# Patient Record
Sex: Female | Born: 1940 | Race: White | Marital: Married | Smoking: Never smoker
Health system: Southern US, Community
[De-identification: ages and names within clinical notes are randomized; demographics above are authoritative.]

## PROBLEM LIST (undated history)

## (undated) DIAGNOSIS — Z1612 Extended spectrum beta lactamase (ESBL) resistance: Secondary | ICD-10-CM

## (undated) DIAGNOSIS — I110 Hypertensive heart disease with heart failure: Secondary | ICD-10-CM

## (undated) DIAGNOSIS — E213 Hyperparathyroidism, unspecified: Secondary | ICD-10-CM

## (undated) DIAGNOSIS — G3184 Mild cognitive impairment, so stated: Secondary | ICD-10-CM

## (undated) DIAGNOSIS — G309 Alzheimer's disease, unspecified: Secondary | ICD-10-CM

## (undated) DIAGNOSIS — N200 Calculus of kidney: Secondary | ICD-10-CM

## (undated) DIAGNOSIS — F0280 Dementia in other diseases classified elsewhere without behavioral disturbance: Secondary | ICD-10-CM

## (undated) DIAGNOSIS — I429 Cardiomyopathy, unspecified: Secondary | ICD-10-CM

## (undated) DIAGNOSIS — R627 Adult failure to thrive: Secondary | ICD-10-CM

## (undated) DIAGNOSIS — E559 Vitamin D deficiency, unspecified: Secondary | ICD-10-CM

## (undated) DIAGNOSIS — A499 Bacterial infection, unspecified: Secondary | ICD-10-CM

## (undated) HISTORY — DX: Bacterial infection, unspecified: A49.9

## (undated) HISTORY — DX: Alzheimer's disease, unspecified: G30.9

## (undated) HISTORY — DX: Extended spectrum beta lactamase (ESBL) resistance: Z16.12

## (undated) HISTORY — DX: Vitamin D deficiency, unspecified: E55.9

## (undated) HISTORY — DX: Hyperparathyroidism, unspecified: E21.3

## (undated) HISTORY — DX: Dementia in other diseases classified elsewhere, unspecified severity, without behavioral disturbance, psychotic disturbance, mood disturbance, and anxiety: F02.80

## (undated) HISTORY — DX: Hypertensive heart disease with heart failure: I11.0

## (undated) HISTORY — DX: Adult failure to thrive: R62.7

## (undated) HISTORY — PX: TONSILLECTOMY: SUR1361

## (undated) HISTORY — DX: Hypercalcemia: E83.52

## (undated) HISTORY — DX: Calculus of kidney: N20.0

## (undated) HISTORY — DX: Mild cognitive impairment of uncertain or unknown etiology: G31.84

## (undated) HISTORY — DX: Cardiomyopathy, unspecified: I42.9

---

## 2015-08-02 DIAGNOSIS — I110 Hypertensive heart disease with heart failure: Secondary | ICD-10-CM | POA: Insufficient documentation

## 2015-08-31 DIAGNOSIS — R0602 Shortness of breath: Secondary | ICD-10-CM | POA: Insufficient documentation

## 2015-09-21 DIAGNOSIS — I429 Cardiomyopathy, unspecified: Secondary | ICD-10-CM | POA: Insufficient documentation

## 2016-04-15 DIAGNOSIS — N200 Calculus of kidney: Secondary | ICD-10-CM | POA: Insufficient documentation

## 2016-05-23 DIAGNOSIS — R627 Adult failure to thrive: Secondary | ICD-10-CM | POA: Insufficient documentation

## 2016-07-25 DIAGNOSIS — G309 Alzheimer's disease, unspecified: Secondary | ICD-10-CM

## 2016-07-25 DIAGNOSIS — F0280 Dementia in other diseases classified elsewhere without behavioral disturbance: Secondary | ICD-10-CM | POA: Insufficient documentation

## 2016-07-30 DIAGNOSIS — E213 Hyperparathyroidism, unspecified: Secondary | ICD-10-CM | POA: Insufficient documentation

## 2016-09-25 DIAGNOSIS — E210 Primary hyperparathyroidism: Secondary | ICD-10-CM | POA: Insufficient documentation

## 2016-10-23 ENCOUNTER — Encounter: Payer: Self-pay | Admitting: Cardiology

## 2016-10-23 ENCOUNTER — Ambulatory Visit (INDEPENDENT_AMBULATORY_CARE_PROVIDER_SITE_OTHER): Payer: Medicare HMO | Admitting: Cardiology

## 2016-10-23 VITALS — BP 128/70 | HR 64 | Resp 10 | Ht 63.0 in | Wt 127.0 lb

## 2016-10-23 DIAGNOSIS — E785 Hyperlipidemia, unspecified: Secondary | ICD-10-CM | POA: Diagnosis not present

## 2016-10-23 DIAGNOSIS — R0602 Shortness of breath: Secondary | ICD-10-CM

## 2016-10-23 DIAGNOSIS — E210 Primary hyperparathyroidism: Secondary | ICD-10-CM

## 2016-10-23 DIAGNOSIS — R5383 Other fatigue: Secondary | ICD-10-CM | POA: Diagnosis not present

## 2016-10-23 DIAGNOSIS — I420 Dilated cardiomyopathy: Secondary | ICD-10-CM | POA: Diagnosis not present

## 2016-10-23 NOTE — Progress Notes (Signed)
Cardiology Office Note:    Date:  10/23/2016   ID:  [name], [place]OB [birth year], MRN [id number]  PCP:  Everlean CherryWhyte, Thomas M, MD  Cardiologist:  Gypsy Balsamobert Anani Gu, MD    Referring MD: Everlean CherryWhyte, Thomas M, MD   Chief Complaint  Patient presents with  . Pre-op Exam  Needs evaluation before my surgery  History of Present Illness:    Patricia Vang is a 76 y.o. female  with history of cardiomyopathy. She was identified to have problem with paratrhyroid and required surgery. Surgery will be done under general anesthesia. She she needs evaluation for that. From cardiac standpoint of view. She is not too active. She can walk and do things but overall she is very quiet she comes to my room with her daughter. Basically dark talks for her. Having any exertional chest pain tightness squeezing pressure burning chest, no shortness of breath. No swelling of lower extremities. I reviewed her record in June 2017 she had cardiac catheterization which showed normal coronaries. Her ejection fraction at that time was normal.  Past Medical History:  Diagnosis Date  . Alzheimer's dementia   . Cardiomyopathy (HCC)   . ESBL (extended spectrum beta-lactamase) producing bacteria infection   . Failure to thrive in adult   . Hypercalcemia   . Hyperparathyroidism (HCC)   . Hypertensive CHF (HCC)   . Mild cognitive impairment   . Nephrolithiasis   . Vitamin D deficiency     Past Surgical History:  Procedure Laterality Date  . TONSILLECTOMY      Current Medications: Current Meds  Medication Sig  . aspirin EC 81 MG tablet Take 81 mg by mouth daily.  Marland Kitchen. atorvastatin (LIPITOR) 80 MG tablet Take 80 mg by mouth daily.  Marland Kitchen. donepezil (ARICEPT) 5 MG tablet Take 5 mg by mouth at bedtime.  . metoprolol tartrate (LOPRESSOR) 25 MG tablet Take 12.5 mg by mouth 2 (two) times daily.  . Multiple Vitamins-Minerals (CENTRUM SILVER 50+WOMEN PO) Take 1 tablet by mouth daily.  . Multiple Vitamins-Minerals  (PRESERVISION AREDS 2 PO) Take 2 tablets by mouth daily.  . pantoprazole (PROTONIX) 40 MG tablet Take 40 mg by mouth daily.  Marland Kitchen. spironolactone (ALDACTONE) 25 MG tablet Take 0.5 tablets by mouth daily.  . valsartan (DIOVAN) 80 MG tablet Take 80 mg by mouth daily.  . vitamin B-12 (CYANOCOBALAMIN) 1000 MCG tablet Take 1 tablet by mouth daily.     Allergies:   Penicillin g and Shellfish-derived products   Social History   Social History  . Marital status: Married    Spouse name: N/A  . Number of children: N/A  . Years of education: N/A   Social History Main Topics  . Smoking status: Never Smoker  . Smokeless tobacco: Never Used  . Alcohol use No  . Drug use: No  . Sexual activity: Not Asked   Other Topics Concern  . None   Social History Narrative  . None     Family History: The patient's family history includes Alzheimer's disease in her daughter and father. ROS:   Please see the history of present illness.    All 14 point review of systems negative except as described per history of present illness  EKGs/Labs/Other Studies Reviewed:      Recent Labs: No results found for requested labs within last 8760 hours.  Recent Lipid Panel No results found for: CHOL, TRIG, HDL, CHOLHDL, VLDL, LDLCALC, LDLDIRECT  Physical Exam:    VS:  BP 128/70  Pulse 64   Resp 10   Ht 5\' 3"  (1.6 m)   Wt 127 lb (57.6 kg)   BMI 22.50 kg/m     Wt Readings from Last 3 Encounters:  10/23/16 127 lb (57.6 kg)     GEN:  Well nourished, well developed in no acute distress HEENT: Normal NECK: No JVD; No carotid bruits LYMPHATICS: No lymphadenopathy CARDIAC: RRR, no murmurs, no rubs, no gallops RESPIRATORY:  Clear to auscultation without rales, wheezing or rhonchi  ABDOMEN: Soft, non-tender, non-distended MUSCULOSKELETAL:  No edema; No deformity  SKIN: Warm and dry LOWER EXTREMITIES: no swelling NEUROLOGIC:  Alert and oriented x 3 PSYCHIATRIC:  Normal affect   ASSESSMENT:    1.  Dilated cardiomyopathy (HCC)   2. Primary hyperparathyroidism (HCC)   3. Shortness of breath   4. Fatigue, unspecified type   5. Dyslipidemia    PLAN:    In order of problems listed above:  1. History of dilated coronary myopathy: I will ask her to have echocardiogram to check left ventricular ejection fraction. She is on metoprolol which I will continue. She is also on spironolactone and Diovan which I'll continue for now. 2. Dyslipidemia: Being followed by internal medicine team she is on high intensity statin I'll continue. 3. Shortness of breath: We'll do echocardiogram to assess left ventricular ejection fraction. 4. Hyperparathyroidism: That we will be addressed surgically.  Pre-op evaluation for this lady with history of cardiomyopathy. Luckily year ago cardiac catheterization showed normal coronaries, she does not have any symptoms that would suggest reactivation of the problem: Therefore from cardiac standpoint to be an coronary artery point review of I don't think we need to do any evaluation for this. However, echocardiogram is necessary to reassess left ventricular ejection fraction because of very clear history. Other issues appears to be stable if her echocardiogram is fine she be at low risk for surgery from cardiac standpoint of view. I will maintain her on beta blocker around surgical time.   Medication Adjustments/Labs and Tests Ordered: Current medicines are reviewed at length with the patient today.  Concerns regarding medicines are outlined above.  No orders of the defined types were placed in this encounter.  Medication changes: No orders of the defined types were placed in this encounter.   Signed, Georgeanna Lea, MD, [facility] 10/23/2016 9:58 AM    [facility]

## 2016-10-23 NOTE — Patient Instructions (Addendum)
Medication Instructions:  Your physician recommends that you continue on your current medications as directed. Please refer to the Current Medication list given to you today. Labwork: None   Testing/Procedures: Your physician has requested that you have an echocardiogram. Echocardiography is a painless test that uses sound waves to create images of your heart. It provides your doctor with information about the size and shape of your heart and how well your heart's chambers and valves are working. This procedure takes approximately one hour. There are no restrictions for this procedure.  Follow-Up: Your physician wants you to follow-up in: 6 months. You will receive a reminder letter in the mail two months in advance. If you don't receive a letter, please call our office to schedule the follow-up appointment. Any Other Special Instructions Will Be Listed Below (If Applicable).  Please note that any paperwork needing to be filled out by the provider will need to be addressed at the front desk prior to seeing the provider. Please note that any paperwork FMLA, Disability or other documents regarding health condition is subject to a $25.00 charge that must be received prior to completion of paperwork.    If you need a refill on your cardiac medications before your next appointment, please call your pharmacy.

## 2016-10-26 DIAGNOSIS — I420 Dilated cardiomyopathy: Secondary | ICD-10-CM | POA: Diagnosis not present

## 2016-11-05 ENCOUNTER — Telehealth: Payer: Self-pay | Admitting: Cardiology

## 2016-11-05 NOTE — Telephone Encounter (Signed)
S/w spouse and advised of results of echo. I encouraged her and spouse that I would call them back once Dr. Bing Matter has reviewed the results.

## 2016-11-05 NOTE — Telephone Encounter (Signed)
Wants echo results from [facility]

## 2016-11-14 ENCOUNTER — Other Ambulatory Visit: Payer: Self-pay

## 2016-11-14 DIAGNOSIS — I420 Dilated cardiomyopathy: Secondary | ICD-10-CM

## 2016-12-19 DIAGNOSIS — Z01818 Encounter for other preprocedural examination: Secondary | ICD-10-CM

## 2017-04-29 ENCOUNTER — Ambulatory Visit: Payer: Medicare HMO | Admitting: Cardiology

## 2017-04-30 ENCOUNTER — Ambulatory Visit: Payer: Medicare HMO | Admitting: Cardiology

## 2017-04-30 ENCOUNTER — Encounter: Payer: Self-pay | Admitting: Cardiology

## 2017-04-30 VITALS — BP 116/70 | HR 75 | Ht 63.0 in | Wt 117.8 lb

## 2017-04-30 DIAGNOSIS — R5383 Other fatigue: Secondary | ICD-10-CM

## 2017-04-30 DIAGNOSIS — F0280 Dementia in other diseases classified elsewhere without behavioral disturbance: Secondary | ICD-10-CM | POA: Diagnosis not present

## 2017-04-30 DIAGNOSIS — I420 Dilated cardiomyopathy: Secondary | ICD-10-CM

## 2017-04-30 DIAGNOSIS — I5032 Chronic diastolic (congestive) heart failure: Secondary | ICD-10-CM

## 2017-04-30 DIAGNOSIS — I110 Hypertensive heart disease with heart failure: Secondary | ICD-10-CM

## 2017-04-30 DIAGNOSIS — G301 Alzheimer's disease with late onset: Secondary | ICD-10-CM | POA: Diagnosis not present

## 2017-04-30 NOTE — Patient Instructions (Signed)
Medication Instructions:  Your physician recommends that you continue on your current medications as directed. Please refer to the Current Medication list given to you today.  Labwork: Your physician recommends that you have lab work today: TSH and B-12  Testing/Procedures: None ordered  Follow-Up: Your physician recommends that you schedule a follow-up appointment in: 6 months with [place]. [place]   Any Other Special Instructions Will Be Listed Below (If Applicable).     If you need a refill on your cardiac medications before your next appointment, please call your pharmacy.

## 2017-04-30 NOTE — Addendum Note (Signed)
Addended by: Arville CareHUNT, Eissa Buchberger N on: 04/30/2017 03:25 PM   Modules accepted: Orders

## 2017-04-30 NOTE — Progress Notes (Signed)
Cardiology Office Note:    Date:  04/30/2017   ID:  Patricia GrumblingClara Mae Hilton Vang, [place]OB [birth year], MRN [id number]  PCP:  Everlean CherryWhyte, Thomas M, MD  Cardiologist:  Gypsy Balsamobert Jawan Chavarria, MD    Referring MD: Everlean CherryWhyte, Thomas M, MD   Chief Complaint  Patient presents with  . Follow-up  Doing well cardiac wise  History of Present Illness:    Patricia Vang is a 77 y.o. female with history of cardiomyopathy however normalization based on last echocardiogram done just few months ago.  She did have surgery for parathyroid went to surgery with no problems however still feel weak tired and exhausted.  According to her daughter she sleeps all day she does not do anything she talks very little.  She does have diagnosis of dementia and she is on medication for it.  Denies have any cardiac complaint.  No chest pain tightness squeezing pressure burning in chest  Past Medical History:  Diagnosis Date  . Alzheimer's dementia   . Cardiomyopathy (HCC)   . ESBL (extended spectrum beta-lactamase) producing bacteria infection   . Failure to thrive in adult   . Hypercalcemia   . Hyperparathyroidism (HCC)   . Hypertensive CHF (HCC)   . Mild cognitive impairment   . Nephrolithiasis   . Vitamin D deficiency     Past Surgical History:  Procedure Laterality Date  . TONSILLECTOMY      Current Medications: Current Meds  Medication Sig  . aspirin EC 81 MG tablet Take 81 mg by mouth daily.  Marland Kitchen. atorvastatin (LIPITOR) 80 MG tablet Take 80 mg by mouth daily.  Marland Kitchen. donepezil (ARICEPT) 5 MG tablet Take 5 mg by mouth at bedtime.  . metoprolol tartrate (LOPRESSOR) 25 MG tablet Take 12.5 mg by mouth 2 (two) times daily.  . Multiple Vitamins-Minerals (CENTRUM SILVER 50+WOMEN PO) Take 1 tablet by mouth daily.  . Multiple Vitamins-Minerals (PRESERVISION AREDS 2 PO) Take 2 tablets by mouth daily.  . pantoprazole (PROTONIX) 40 MG tablet Take 40 mg by mouth daily.  . Pseudoephedrine-Guaifenesin (MUCINEX D PO) Take by mouth  every 12 (twelve) hours as needed.  Marland Kitchen. spironolactone (ALDACTONE) 25 MG tablet Take 0.5 tablets by mouth daily.  . vitamin B-12 (CYANOCOBALAMIN) 1000 MCG tablet Take 1 tablet by mouth daily.  . [DISCONTINUED] vitamin B-12 (CYANOCOBALAMIN) 1000 MCG tablet Take by mouth.     Allergies:   Penicillin g and Shellfish-derived products   Social History   Socioeconomic History  . Marital status: Married    Spouse name: None  . Number of children: None  . Years of education: None  . Highest education level: None  Social Needs  . Financial resource strain: None  . Food insecurity - worry: None  . Food insecurity - inability: None  . Transportation needs - medical: None  . Transportation needs - non-medical: None  Occupational History  . None  Tobacco Use  . Smoking status: Never Smoker  . Smokeless tobacco: Never Used  Substance and Sexual Activity  . Alcohol use: No  . Drug use: No  . Sexual activity: None  Other Topics Concern  . None  Social History Narrative  . None     Family History: The patient's family history includes Alzheimer's disease in her daughter and father. ROS:   Please see the history of present illness.    All 14 point review of systems negative except as described per history of present illness  EKGs/Labs/Other Studies Reviewed:      Recent  Labs: No results found for requested labs within last 8760 hours.  Recent Lipid Panel No results found for: CHOL, TRIG, HDL, CHOLHDL, VLDL, LDLCALC, LDLDIRECT  Physical Exam:    VS:  BP 116/70 (BP Location: Left Arm, Patient Position: Sitting, Cuff Size: Normal)   Pulse 75   Ht 5\' 3"  (1.6 m)   Wt 117 lb 12.8 oz (53.4 kg)   SpO2 98%   BMI 20.87 kg/m     Wt Readings from Last 3 Encounters:  04/30/17 117 lb 12.8 oz (53.4 kg)  10/23/16 127 lb (57.6 kg)     GEN:  Well nourished, well developed in no acute distress HEENT: Normal NECK: No JVD; No carotid bruits LYMPHATICS: No lymphadenopathy CARDIAC: RRR,  no murmurs, no rubs, no gallops RESPIRATORY:  Clear to auscultation without rales, wheezing or rhonchi  ABDOMEN: Soft, non-tender, non-distended MUSCULOSKELETAL:  No edema; No deformity  SKIN: Warm and dry LOWER EXTREMITIES: no swelling NEUROLOGIC:  Alert and oriented x 3 PSYCHIATRIC:  Normal affect   ASSESSMENT:    1. Hypertensive heart disease with chronic diastolic congestive heart failure (HCC)   2. Late onset Alzheimer's disease without behavioral disturbance   3. Dilated cardiomyopathy (HCC)    PLAN:    In order of problems listed above:  1. Hypertensive heart disease: Blood pressure well controlled on appropriate medication which I will continue in the matter-of-fact recently her medication to be adjusted meaning dose of lower now because of low blood pressure. 2. Dementia: Followed by internal medicine team. 3. Cardiomyopathy: Last echocardiogram showed preserved left ventricular ejection fraction.  We will continue monitoring. 4. Profound weakness fatigue and sleepiness.  I will ask you to have TSH done as well as B12 point he should take some B12 oral supplement.  I would make sure she does not have problem with intrinsic factor that can lead to significant B12 deficiency and dementia   Medication Adjustments/Labs and Tests Ordered: Current medicines are reviewed at length with the patient today.  Concerns regarding medicines are outlined above.  No orders of the defined types were placed in this encounter.  Medication changes: No orders of the defined types were placed in this encounter.   Signed, Georgeanna Lea, MD, [facility] 04/30/2017 3:20 PM    [facility]

## 2017-05-01 ENCOUNTER — Telehealth: Payer: Self-pay | Admitting: Cardiology

## 2017-05-01 LAB — VITAMIN B12

## 2017-05-01 LAB — TSH: TSH: 1.88 u[IU]/mL (ref 0.450–4.500)

## 2017-05-01 NOTE — Telephone Encounter (Signed)
Please call Boyd Kerbsenny with vitamin B12 results for [place]

## 2017-05-02 NOTE — Telephone Encounter (Signed)
Penny notified of Vitamin B12 results.  Boyd Kerbsenny also advised to contact PCP for further instruction on B12 levels.  Boyd Kerbsenny verbalized understanding.

## 2017-12-19 ENCOUNTER — Ambulatory Visit: Payer: Medicare HMO | Admitting: Cardiology

## 2019-05-26 DIAGNOSIS — I340 Nonrheumatic mitral (valve) insufficiency: Secondary | ICD-10-CM

## 2019-05-26 DIAGNOSIS — I361 Nonrheumatic tricuspid (valve) insufficiency: Secondary | ICD-10-CM

## 2019-10-02 ENCOUNTER — Telehealth: Payer: Self-pay | Admitting: Internal Medicine

## 2019-10-02 ENCOUNTER — Inpatient Hospital Stay (HOSPITAL_COMMUNITY)
Admission: AD | Admit: 2019-10-02 | Discharge: 2019-10-18 | DRG: 871 | Disposition: E | Payer: No Typology Code available for payment source | Source: Other Acute Inpatient Hospital | Attending: Critical Care Medicine | Admitting: Critical Care Medicine

## 2019-10-02 DIAGNOSIS — F0280 Dementia in other diseases classified elsewhere without behavioral disturbance: Secondary | ICD-10-CM | POA: Diagnosis present

## 2019-10-02 DIAGNOSIS — Z79899 Other long term (current) drug therapy: Secondary | ICD-10-CM

## 2019-10-02 DIAGNOSIS — I110 Hypertensive heart disease with heart failure: Secondary | ICD-10-CM | POA: Diagnosis present

## 2019-10-02 DIAGNOSIS — Z87440 Personal history of urinary (tract) infections: Secondary | ICD-10-CM | POA: Diagnosis not present

## 2019-10-02 DIAGNOSIS — R4182 Altered mental status, unspecified: Secondary | ICD-10-CM | POA: Diagnosis present

## 2019-10-02 DIAGNOSIS — R0603 Acute respiratory distress: Secondary | ICD-10-CM

## 2019-10-02 DIAGNOSIS — D649 Anemia, unspecified: Secondary | ICD-10-CM | POA: Diagnosis present

## 2019-10-02 DIAGNOSIS — R6521 Severe sepsis with septic shock: Secondary | ICD-10-CM | POA: Diagnosis present

## 2019-10-02 DIAGNOSIS — J189 Pneumonia, unspecified organism: Secondary | ICD-10-CM | POA: Diagnosis present

## 2019-10-02 DIAGNOSIS — G309 Alzheimer's disease, unspecified: Secondary | ICD-10-CM | POA: Diagnosis present

## 2019-10-02 DIAGNOSIS — E213 Hyperparathyroidism, unspecified: Secondary | ICD-10-CM | POA: Diagnosis present

## 2019-10-02 DIAGNOSIS — Z66 Do not resuscitate: Secondary | ICD-10-CM | POA: Diagnosis not present

## 2019-10-02 DIAGNOSIS — L89152 Pressure ulcer of sacral region, stage 2: Secondary | ICD-10-CM | POA: Diagnosis present

## 2019-10-02 DIAGNOSIS — J9601 Acute respiratory failure with hypoxia: Secondary | ICD-10-CM | POA: Diagnosis present

## 2019-10-02 DIAGNOSIS — E872 Acidosis: Secondary | ICD-10-CM | POA: Diagnosis present

## 2019-10-02 DIAGNOSIS — Z8619 Personal history of other infectious and parasitic diseases: Secondary | ICD-10-CM | POA: Diagnosis not present

## 2019-10-02 DIAGNOSIS — A419 Sepsis, unspecified organism: Secondary | ICD-10-CM | POA: Diagnosis present

## 2019-10-02 DIAGNOSIS — E870 Hyperosmolality and hypernatremia: Secondary | ICD-10-CM | POA: Diagnosis present

## 2019-10-02 DIAGNOSIS — E559 Vitamin D deficiency, unspecified: Secondary | ICD-10-CM | POA: Diagnosis present

## 2019-10-02 DIAGNOSIS — T82528A Displacement of other cardiac and vascular devices and implants, initial encounter: Secondary | ICD-10-CM

## 2019-10-02 DIAGNOSIS — D696 Thrombocytopenia, unspecified: Secondary | ICD-10-CM | POA: Diagnosis present

## 2019-10-02 DIAGNOSIS — N200 Calculus of kidney: Secondary | ICD-10-CM

## 2019-10-02 DIAGNOSIS — Z20822 Contact with and (suspected) exposure to covid-19: Secondary | ICD-10-CM | POA: Diagnosis present

## 2019-10-02 DIAGNOSIS — B964 Proteus (mirabilis) (morganii) as the cause of diseases classified elsewhere: Secondary | ICD-10-CM | POA: Diagnosis present

## 2019-10-02 DIAGNOSIS — Z7982 Long term (current) use of aspirin: Secondary | ICD-10-CM

## 2019-10-02 DIAGNOSIS — Z91013 Allergy to seafood: Secondary | ICD-10-CM

## 2019-10-02 DIAGNOSIS — Z87442 Personal history of urinary calculi: Secondary | ICD-10-CM | POA: Diagnosis not present

## 2019-10-02 DIAGNOSIS — N179 Acute kidney failure, unspecified: Secondary | ICD-10-CM | POA: Diagnosis present

## 2019-10-02 DIAGNOSIS — Z880 Allergy status to penicillin: Secondary | ICD-10-CM

## 2019-10-02 DIAGNOSIS — N136 Pyonephrosis: Secondary | ICD-10-CM | POA: Diagnosis present

## 2019-10-02 DIAGNOSIS — I509 Heart failure, unspecified: Secondary | ICD-10-CM | POA: Diagnosis present

## 2019-10-02 DIAGNOSIS — Y95 Nosocomial condition: Secondary | ICD-10-CM | POA: Diagnosis present

## 2019-10-02 DIAGNOSIS — E8729 Other acidosis: Secondary | ICD-10-CM | POA: Diagnosis not present

## 2019-10-02 DIAGNOSIS — Z515 Encounter for palliative care: Secondary | ICD-10-CM | POA: Diagnosis not present

## 2019-10-02 DIAGNOSIS — I429 Cardiomyopathy, unspecified: Secondary | ICD-10-CM | POA: Diagnosis present

## 2019-10-02 DIAGNOSIS — Z820 Family history of epilepsy and other diseases of the nervous system: Secondary | ICD-10-CM

## 2019-10-02 DIAGNOSIS — L8990 Pressure ulcer of unspecified site, unspecified stage: Secondary | ICD-10-CM | POA: Insufficient documentation

## 2019-10-02 LAB — COMPREHENSIVE METABOLIC PANEL
ALT: 38 U/L (ref 0–44)
AST: 68 U/L — ABNORMAL HIGH (ref 15–41)
Albumin: 2.4 g/dL — ABNORMAL LOW (ref 3.5–5.0)
Alkaline Phosphatase: 121 U/L (ref 38–126)
Anion gap: 10 (ref 5–15)
BUN: 59 mg/dL — ABNORMAL HIGH (ref 8–23)
CO2: 18 mmol/L — ABNORMAL LOW (ref 22–32)
Calcium: 7.5 mg/dL — ABNORMAL LOW (ref 8.9–10.3)
Chloride: 107 mmol/L (ref 98–111)
Creatinine, Ser: 5.47 mg/dL — ABNORMAL HIGH (ref 0.44–1.00)
GFR calc Af Amer: 8 mL/min — ABNORMAL LOW (ref >60)
GFR calc non Af Amer: 7 mL/min — ABNORMAL LOW (ref >60)
Glucose, Bld: 82 mg/dL (ref 70–99)
Potassium: 4.4 mmol/L (ref 3.5–5.1)
Sodium: 135 mmol/L (ref 135–145)
Total Bilirubin: 0.7 mg/dL (ref 0.3–1.2)
Total Protein: 5.5 g/dL — ABNORMAL LOW (ref 6.5–8.1)

## 2019-10-02 LAB — MRSA PCR SCREENING: MRSA by PCR: NEGATIVE

## 2019-10-02 MED ORDER — SODIUM CHLORIDE 0.9 % IV SOLN
250.0000 mL | INTRAVENOUS | Status: DC
  Administered 2019-10-02: 250 mL via INTRAVENOUS

## 2019-10-02 MED ORDER — DOCUSATE SODIUM 100 MG PO CAPS: 100.0000 mg | ORAL_CAPSULE | Freq: Two times a day (BID) | ORAL | Status: DC | PRN

## 2019-10-02 MED ORDER — NOREPINEPHRINE 4 MG/250ML-% IV SOLN: 0.0000 ug/min | INTRAVENOUS | Status: DC

## 2019-10-02 MED ORDER — SODIUM CHLORIDE 0.9 % IV SOLN
2.0000 g | Freq: Every day | INTRAVENOUS | Status: DC
  Administered 2019-10-02 – 2019-10-03 (×2): 2 g via INTRAVENOUS
  Filled 2019-10-02 (×2): qty 20

## 2019-10-02 MED ORDER — NOREPINEPHRINE 4 MG/250ML-% IV SOLN
2.0000 ug/min | INTRAVENOUS | Status: DC
  Administered 2019-10-02: 2 ug/min via INTRAVENOUS
  Filled 2019-10-02: qty 250

## 2019-10-02 MED ORDER — CHLORHEXIDINE GLUCONATE CLOTH 2 % EX PADS
6.0000 | MEDICATED_PAD | Freq: Every day | CUTANEOUS | Status: DC
  Administered 2019-10-03 – 2019-10-06 (×4): 6 via TOPICAL

## 2019-10-02 MED ORDER — POLYETHYLENE GLYCOL 3350 17 G PO PACK: 17.0000 g | PACK | Freq: Every day | ORAL | Status: DC | PRN

## 2019-10-02 NOTE — Telephone Encounter (Signed)
Call from Patricia Vang ER regading Patricia Vang 28-Apr-1940 [address] [place] [address]   Hx of dementia. Lives at home and [facility].   UTI - placed on bactrim x 2 days. Getting worse. Walking less, More lethargic. Hypotensie reoute 90/50 and fluid 300cc given by EMS and responsded. Afebrile    has a past medical history of Alzheimer's dementia, Cardiomyopathy (HCC), ESBL (extended spectrum beta-lactamase) producing bacteria infection, Failure to thrive in adult, Hypercalcemia, Hyperparathyroidism (HCC), Hypertensive CHF (HCC), Mild cognitive impairment, Nephrolithiasis, and Vitamin D deficiency.   has a past surgical history that includes Tonsillectomy.  Scheduled Meds: Continuous Infusions: PRN Meds:.   Current Outpatient Medications  Medication Instructions  . aspirin EC 81 mg, Oral, Daily  . atorvastatin (LIPITOR) 80 mg, Oral, Daily  . donepezil (ARICEPT) 5 mg, Oral, Daily at bedtime  . metoprolol tartrate (LOPRESSOR) 12.5 mg, Oral, 2 times daily  . Multiple Vitamins-Minerals (CENTRUM SILVER 50+WOMEN PO) 1 tablet, Oral, Daily  . Multiple Vitamins-Minerals (PRESERVISION AREDS 2 PO) 2 tablets, Oral, Daily  . pantoprazole (PROTONIX) 40 mg, Oral, Daily  . Pseudoephedrine-Guaifenesin (MUCINEX D PO) Oral, Every 12 hours PRN  . spironolactone (ALDACTONE) 25 MG tablet 0.5 tablets, Oral, Daily  . vitamin B-12 (CYANOCOBALAMIN) 1000 MCG tablet 1 tablet, Oral, Daily     [facility]  - non verbal at baseline - other than yes/no which is baseline - not oriented also on baseline - after 2L fluids SBP 80/42 and ER goind to start on levophed, HR 80s, afebrile, 98% on RA - Not in distress - abd soft  Labs  - UA dirty - WBC high  - lacatate  - creat 5.5 (baseline 0.9) - lactate 4.5 - CT abd - left sided proximal ureter 1cm - > HYDRO  A Uti  Septic shock Lactic acodis AKI - with hydro  P (no ICU bed at  [place]) - [place] [facility] ICU due to urology proximity but needs perc drain by IR first - ceftriaxone  -- fluids annd levophed      SIGNATURE    Patricia. Kalman Shan, M.D., F.C.C.P,  Pulmonary and Critical Care Medicine Staff Physician, [facility] Center Director - Interstitial Lung Disease  Program  Pulmonary Fibrosis [facility] [place], [place], [address]  Pager: [phone], If no answer or between  15:00h - 7:00h: call [phone] Telephone: [phone]  5:35 PM Oct 19, 2019

## 2019-10-02 NOTE — H&P (Signed)
NAME:  Patricia Vang, MRN:  [id number], DOB:  29-Jul-1940, LOS: 0 ADMISSION DATE:  09/24/2019, CONSULTATION DATE:  7/16 REFERRING MD:  Duke Salvia, CHIEF COMPLAINT:  Sepsis, hydronephrosis   Brief History   79yo non-verbal female with hx dementia, CHF with recent UTI treated with bactrim x2 days but presented to [facility] 7/16 with worsening mental status, less mobile at home.  Hypotensive on arrival with SBP 90's, AKI with Scr 5.5, lactate 4.5. Remaining labs include WBC 15.4 Hg 11.2 Plt 67, PT/INR 11.4/1.1, Na 137 K 3.8 Cl 102 CO2 16 BUN/Cr 61/5.5, Tbili 0.3 CT abd revealed moderate L hydronephrosis extending down to a 1.0 cm proximal ureteral calculus and just proximal to this is a 0.6 cm proximal uteral calculus. UA opaque and brown with 1+ protein, 3+ blood, 3+ leuks. She was tx to [facility] for ICU care and IR/urology.    History of present illness   79yo nonverbal female with hx dementia, CHF with recent UTI treated with bactrim x2 days but presented to [facility] 7/16 with worsening mental status, less mobile at home.  Hypotensive on arrival with SBP 90's, AKI with Scr 5.5, lactate 4.5. CT abd revealed hydronephrosis. At [facility] ED, patient was given Ceftriaxone, 1L NS bolus x 3. Central line was placed for initiation of levophed. She was tx to [facility] for ICU care and IR/urology. On arrival, she is non-verbal and on low-dose vasopressor support.    Past Medical History   has a past medical history of Alzheimer's dementia, Cardiomyopathy (HCC), ESBL (extended spectrum beta-lactamase) producing bacteria infection, Failure to thrive in adult, Hypercalcemia, Hyperparathyroidism (HCC), Hypertensive CHF (HCC), Mild cognitive impairment, Nephrolithiasis, and Vitamin D deficiency.   Significant Hospital Events   7/16 Transferred from [facility] to [facility]  Consults:  IR urology  Procedures:  R IJ CVL ([place]) 7/16>>>  Significant Diagnostic Tests:  CT abd/pelvis  ([place])>>>  Micro Data:  Urine 7/16>>> BC x 2 7/16>>>  Antimicrobials:  Ceftriaxone 7/16>>>   Interim history/subjective:    Objective   There were no vitals taken for this visit.       No intake or output data in the 24 hours ending 09/28/2019 2323 There were no vitals filed for this visit.  Physical Exam: General: Elderly, chronically ill-appearing, no acute distress, non-verbal HENT: [place], AT, OP clear, MMM Eyes: EOMI, no scleral icterus Respiratory: Clear to auscultation bilaterally.  No crackles, wheezing or rales Cardiovascular: Tachycardic, regular rhythm, -M/R/G, no JVD GI: BS+,  L flank tenderness to palpation Extremities:-Edema,-tenderness Neuro: Awake, alert, non-verbal, does not follow commands, moves extremities x 4 Skin: Intact, no rashes or bruising  Resolved Hospital Problem list     Assessment & Plan:   Septic shock - in setting UTI/pyelonephritis with hydronephrosis.  PLAN -  IV fluids  Continue ceftriaxone Trend lactate Continue levophed - wean as able to keep MAP >65 IR perc nephrostomy  Urology input  Pan culture   AKI  PLAN -  Volume as above  F/u chem now and in am  Consulted IR to evaluate for perc drain   Hx dementia - nonverbal at baseline  PLAN -  Supportive care   Best practice:  Diet: NPO Pain/Anxiety/Delirium protocol (if indicated): n/a VAP protocol (if indicated): n/a DVT prophylaxis: Hold anticoagulation in setting of pending procedure GI prophylaxis: n/a Glucose control: n/a Mobility: BR Code Status: Full code Family Communication: Jethro Poling, patient's son and HCPOA [phone]) Disposition:   Labs   CBC: No results  for input(s): WBC, NEUTROABS, HGB, HCT, MCV, PLT in the last 168 hours.  Basic Metabolic Panel: No results for input(s): NA, K, CL, CO2, GLUCOSE, BUN, CREATININE, CALCIUM, MG, PHOS in the last 168 hours. GFR: CrCl cannot be calculated (No successful lab value found.). No results for input(s):  PROCALCITON, WBC, LATICACIDVEN in the last 168 hours.  Liver Function Tests: No results for input(s): AST, ALT, ALKPHOS, BILITOT, PROT, ALBUMIN in the last 168 hours. No results for input(s): LIPASE, AMYLASE in the last 168 hours. No results for input(s): AMMONIA in the last 168 hours.  ABG No results found for: PHART, PCO2ART, PO2ART, HCO3, TCO2, ACIDBASEDEF, O2SAT   Coagulation Profile: No results for input(s): INR, PROTIME in the last 168 hours.  Cardiac Enzymes: No results for input(s): CKTOTAL, CKMB, CKMBINDEX, TROPONINI in the last 168 hours.  HbA1C: No results found for: HGBA1C  CBG: No results for input(s): GLUCAP in the last 168 hours.  Review of Systems:   Unable to obtain due to non-verbal status  Past Medical History  She,  has a past medical history of Alzheimer's dementia, Cardiomyopathy (HCC), ESBL (extended spectrum beta-lactamase) producing bacteria infection, Failure to thrive in adult, Hypercalcemia, Hyperparathyroidism (HCC), Hypertensive CHF (HCC), Mild cognitive impairment, Nephrolithiasis, and Vitamin D deficiency.   Surgical History    Past Surgical History:  Procedure Laterality Date  . TONSILLECTOMY       Social History   reports that she has never smoked. She has never used smokeless tobacco. She reports that she does not drink alcohol and does not use drugs.   Family History   Her family history includes Alzheimer's disease in her daughter and father.   Allergies Allergies  Allergen Reactions  . Penicillin G Hives  . Shellfish-Derived Products     Other reaction(s): Other (See Comments) unsure     Home Medications  Prior to Admission medications   Medication Sig Start Date End Date Taking? Authorizing Provider  aspirin EC 81 MG tablet Take 81 mg by mouth daily.    [provider]  atorvastatin (LIPITOR) 80 MG tablet Take 80 mg by mouth daily. 08/03/15   [provider]  donepezil (ARICEPT) 5 MG tablet Take 5 mg by  mouth at bedtime. 07/25/16   [provider]  metoprolol tartrate (LOPRESSOR) 25 MG tablet Take 12.5 mg by mouth 2 (two) times daily. 08/03/15   [provider]  Multiple Vitamins-Minerals (CENTRUM SILVER 50+WOMEN PO) Take 1 tablet by mouth daily.    [provider]  Multiple Vitamins-Minerals (PRESERVISION AREDS 2 PO) Take 2 tablets by mouth daily.    [provider]  pantoprazole (PROTONIX) 40 MG tablet Take 40 mg by mouth daily. 08/03/15   [provider]  Pseudoephedrine-Guaifenesin (MUCINEX D PO) Take by mouth every 12 (twelve) hours as needed.    [provider]  spironolactone (ALDACTONE) 25 MG tablet Take 0.5 tablets by mouth daily. 03/30/16   [provider]  vitamin B-12 (CYANOCOBALAMIN) 1000 MCG tablet Take 1 tablet by mouth daily.    [provider]     Critical care time: 60 min    The patient is critically ill with multiple organ systems failure and requires high complexity decision making for assessment and support, frequent evaluation and titration of therapies, application of advanced monitoring technologies and extensive interpretation of multiple databases.   Mechele Collin, M.D. [facility] Pulmonary/Critical Care Medicine 09/23/2019 11:40 PM   Please see Amion for pager number to reach on-call Pulmonary and  Critical Care Team.

## 2019-10-02 NOTE — Progress Notes (Addendum)
eLink Physician-Brief Progress Note Patient Name: Floree Zuniga DOB: Aug 31, 1940 MRN: [id number]   Date of Service  10-14-2019  HPI/Events of Note  79/F with dementia, treated with bactrim for UTI but was noted to be hypotensive and noted to have hydronephrosis on imaging.  Pt was transferred from [facility] ER.  Pt has been started on ceftriaxone and levophed.  R IJ in place.   Pt is awake and alert and baseline responds yes/no.  eICU Interventions  Septic shock Pyelonephritis Hydronephrosis  Continue empiric antibiotics and pressors.  Pt needs IR percutaneous nephrostomy.  Recheck labs.       Intervention Category Evaluation Type: New Patient Evaluation  Larinda Buttery 2019-10-14, 10:15 PM   1:13 AM Notified by IR attending that only the patient's husband could be reached to obtain informed consent.    I called the number of Gene Montez Hageman who is supposedly the [facility] but also could not reach him.   I agree that this procedure is warranted emergently as the patient is in septic shock. IR will perform percutaneous nephrostomy as planned and recommended.

## 2019-10-03 ENCOUNTER — Inpatient Hospital Stay (HOSPITAL_COMMUNITY): Payer: No Typology Code available for payment source

## 2019-10-03 DIAGNOSIS — L8990 Pressure ulcer of unspecified site, unspecified stage: Secondary | ICD-10-CM | POA: Insufficient documentation

## 2019-10-03 DIAGNOSIS — A419 Sepsis, unspecified organism: Principal | ICD-10-CM

## 2019-10-03 DIAGNOSIS — N179 Acute kidney failure, unspecified: Secondary | ICD-10-CM | POA: Diagnosis present

## 2019-10-03 DIAGNOSIS — D696 Thrombocytopenia, unspecified: Secondary | ICD-10-CM | POA: Diagnosis present

## 2019-10-03 DIAGNOSIS — R6521 Severe sepsis with septic shock: Secondary | ICD-10-CM

## 2019-10-03 HISTORY — PX: IR NEPHROSTOMY PLACEMENT LEFT: IMG6063

## 2019-10-03 LAB — CBC
HCT: 31.6 % — ABNORMAL LOW (ref 36.0–46.0)
HCT: 31.9 % — ABNORMAL LOW (ref 36.0–46.0)
Hemoglobin: 10.6 g/dL — ABNORMAL LOW (ref 12.0–15.0)
Hemoglobin: 10.3 g/dL — ABNORMAL LOW (ref 12.0–15.0)
MCH: 29.9 pg (ref 26.0–34.0)
MCH: 29 pg (ref 26.0–34.0)
MCHC: 33.5 g/dL (ref 30.0–36.0)
MCHC: 32.3 g/dL (ref 30.0–36.0)
MCV: 89 fL (ref 80.0–100.0)
MCV: 89.9 fL (ref 80.0–100.0)
Platelets: 79 10*3/uL — ABNORMAL LOW (ref 150–400)
Platelets: 76 10*3/uL — ABNORMAL LOW (ref 150–400)
RBC: 3.55 MIL/uL — ABNORMAL LOW (ref 3.87–5.11)
RBC: 3.55 MIL/uL — ABNORMAL LOW (ref 3.87–5.11)
RDW: 17.1 % — ABNORMAL HIGH (ref 11.5–15.5)
RDW: 17.2 % — ABNORMAL HIGH (ref 11.5–15.5)
WBC: 24.4 10*3/uL — ABNORMAL HIGH (ref 4.0–10.5)
WBC: 18.5 10*3/uL — ABNORMAL HIGH (ref 4.0–10.5)
nRBC: 0 % (ref 0.0–0.2)
nRBC: 0 % (ref 0.0–0.2)

## 2019-10-03 LAB — COMPREHENSIVE METABOLIC PANEL
ALT: 35 U/L (ref 0–44)
AST: 65 U/L — ABNORMAL HIGH (ref 15–41)
Albumin: 2.2 g/dL — ABNORMAL LOW (ref 3.5–5.0)
Alkaline Phosphatase: 112 U/L (ref 38–126)
Anion gap: 11 (ref 5–15)
BUN: 61 mg/dL — ABNORMAL HIGH (ref 8–23)
CO2: 17 mmol/L — ABNORMAL LOW (ref 22–32)
Calcium: 7.5 mg/dL — ABNORMAL LOW (ref 8.9–10.3)
Chloride: 107 mmol/L (ref 98–111)
Creatinine, Ser: 5.44 mg/dL — ABNORMAL HIGH (ref 0.44–1.00)
GFR calc Af Amer: 8 mL/min — ABNORMAL LOW (ref >60)
GFR calc non Af Amer: 7 mL/min — ABNORMAL LOW (ref >60)
Glucose, Bld: 77 mg/dL (ref 70–99)
Potassium: 4.3 mmol/L (ref 3.5–5.1)
Sodium: 135 mmol/L (ref 135–145)
Total Bilirubin: 0.5 mg/dL (ref 0.3–1.2)
Total Protein: 5.2 g/dL — ABNORMAL LOW (ref 6.5–8.1)

## 2019-10-03 LAB — PROTIME-INR
INR: 1.2 (ref 0.8–1.2)
Prothrombin Time: 14.6 seconds (ref 11.4–15.2)

## 2019-10-03 LAB — SARS CORONAVIRUS 2 BY RT PCR (HOSPITAL ORDER, PERFORMED IN ~~LOC~~ HOSPITAL LAB): SARS Coronavirus 2: NEGATIVE

## 2019-10-03 MED ORDER — IOHEXOL 300 MG/ML  SOLN
50.0000 mL | Freq: Once | INTRAMUSCULAR | Status: AC | PRN
  Administered 2019-10-03: 10 mL

## 2019-10-03 MED ORDER — MIDAZOLAM HCL 2 MG/2ML IJ SOLN
INTRAMUSCULAR | Status: AC | PRN
  Administered 2019-10-03 (×2): 0.5 mg via INTRAVENOUS

## 2019-10-03 MED ORDER — SODIUM CHLORIDE 0.9 % IV SOLN
250.0000 mL | INTRAVENOUS | Status: DC
  Administered 2019-10-03: 250 mL via INTRAVENOUS

## 2019-10-03 MED ORDER — ACETAMINOPHEN 650 MG RE SUPP: 325.0000 mg | Freq: Three times a day (TID) | RECTAL | Status: DC | PRN

## 2019-10-03 MED ORDER — FENTANYL CITRATE (PF) 100 MCG/2ML IJ SOLN
INTRAMUSCULAR | Status: AC | PRN
  Administered 2019-10-03 (×2): 25 ug via INTRAVENOUS

## 2019-10-03 MED ORDER — DEXTROSE IN LACTATED RINGERS 5 % IV SOLN: INTRAVENOUS | Status: DC

## 2019-10-03 MED ORDER — MIDAZOLAM HCL 2 MG/2ML IJ SOLN
INTRAMUSCULAR | Status: AC
  Filled 2019-10-03: qty 2

## 2019-10-03 MED ORDER — LIDOCAINE HCL 1 % IJ SOLN
INTRAMUSCULAR | Status: AC
  Filled 2019-10-03: qty 20

## 2019-10-03 MED ORDER — SODIUM CHLORIDE 0.9 % IV SOLN: 250.0000 mL | INTRAVENOUS | Status: DC

## 2019-10-03 MED ORDER — FENTANYL CITRATE (PF) 100 MCG/2ML IJ SOLN
INTRAMUSCULAR | Status: AC
  Filled 2019-10-03: qty 2

## 2019-10-03 MED ORDER — ALBUTEROL SULFATE (2.5 MG/3ML) 0.083% IN NEBU
2.5000 mg | INHALATION_SOLUTION | RESPIRATORY_TRACT | Status: DC | PRN
  Administered 2019-10-03 – 2019-10-05 (×3): 2.5 mg via RESPIRATORY_TRACT
  Filled 2019-10-03 (×4): qty 3

## 2019-10-03 NOTE — Evaluation (Signed)
Clinical/Bedside Swallow Evaluation Patient Details  Name: Patricia Vang MRN: [id number] Date of Birth: 1941/02/13  Today's Date: 10/03/2019 Time: SLP Start Time (ACUTE ONLY): 1545 SLP Stop Time (ACUTE ONLY): 1610 SLP Time Calculation (min) (ACUTE ONLY): 25 min  Past Medical History:  Past Medical History:  Diagnosis Date  . Alzheimer's dementia (HCC)   . Cardiomyopathy (HCC)   . ESBL (extended spectrum beta-lactamase) producing bacteria infection   . Failure to thrive in adult   . Hypercalcemia   . Hyperparathyroidism (HCC)   . Hypertensive CHF (HCC)   . Mild cognitive impairment   . Nephrolithiasis   . Vitamin D deficiency    Past Surgical History:  Past Surgical History:  Procedure Laterality Date  . IR NEPHROSTOMY PLACEMENT LEFT  10/03/2019  . TONSILLECTOMY     HPI:  79 yo female adm to [facility] with AMS - pt with left side ureteral obstructing stone with urosepsis s/p left nephrostomy tube placement.  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  She also was diagnosed with an aspiration pna in 07/2015. RN raised concerns regarding pt 's swallowing and pt was made npo.  SLP swallow eval was ordered.   Assessment / Plan / Recommendation Clinical Impression  Pt currently presents with s/s of gross aspiration and severe dysphagia across all intake provided.  RR up to 30s with open mouth breathing.  She did not follow directions adequately but would open mouth to accept minimal intake.  After intake of 2 tiny ice chips and 1/4 tsp puree- pt with increased WOB and wheeze with delayed weak cough. SLP would recommend pt be npo except very tiny ice chips with strict precautions.  Will follow up next date for po readiness.  Pt may benefit from MBS when she is appropriate due to level of dysphagia. Marland Kitchen SLP Visit Diagnosis: Dysphagia, oropharyngeal phase  (R13.12)    Aspiration Risk  Severe aspiration risk;Risk for inadequate nutrition/hydration    Diet Recommendation NPO        Other  Recommendations   n/a  Follow up Recommendations   n/a     Frequency and Duration min 1 x/week  1 week       Prognosis Prognosis for Safe Diet Advancement: Guarded      Swallow Study   General Date of Onset: 10/03/19 HPI: 79 yo female adm to [facility] with AMS - pt with left side ureteral obstructing stone with urosepsis s/p left nephrostomy tube placement.  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  She also was diagnosed with an aspiration pna in 07/2015. RN raised concerns regarding pt 's swallowing and pt was made npo.  SLP swallow eval was ordered. Type of Study: Bedside Swallow Evaluation Diet Prior to this Study: NPO Temperature Spikes Noted: No Respiratory Status: Nasal cannula History of Recent Intubation: No Behavior/Cognition: Doesn't follow directions;Confused;Agitated;Distractible Oral Cavity Assessment: Other (comment) Oral Care Completed by SLP: No Oral Cavity - Dentition: Edentulous Vision: Functional for self-feeding Self-Feeding Abilities: Able to feed self Patient Positioning: Upright in bed Baseline Vocal Quality: Normal;Low vocal intensity Volitional Cough: Weak Volitional Swallow: Unable to elicit    Oral/Motor/Sensory Function Overall Oral Motor/Sensory Function: Generalized oral weakness   Ice Chips Other Comments: 1/2 inch of ice chips   Thin Liquid Thin Liquid: Not tested    Nectar Thick Nectar Thick  Liquid: Not tested Other Comments: pt declined to consume tsp of nectar orange juice offered   Honey Thick Honey Thick Liquid: Not tested   Puree Puree: Impaired Presentation: Self Fed;Spoon Pharyngeal Phase Impairments: Suspected delayed Swallow Other Comments: 1/4 tsp of pureed    Solid     Solid: Not tested      Chales Abrahams 10/03/2019,4:57 PM   Rolena Infante, MS [facility] SLP Acute Rehab Services Office [phone]

## 2019-10-03 NOTE — Progress Notes (Signed)
eLink Physician-Brief Progress Note Patient Name: Patricia Vang DOB: 07-Mar-1941 MRN: [id number]   Date of Service  10/03/2019  HPI/Events of Note  CVC line placed  eICU Interventions  CxR stat ordered for placement confirmation     Intervention Category Minor Interventions: Other:  Ranee Gosselin 10/03/2019, 8:33 PM

## 2019-10-03 NOTE — Progress Notes (Signed)
eLink Physician-Brief Progress Note Patient Name: Patricia Vang DOB: March 07, 1941 MRN: [id number]   Date of Service  10/03/2019  HPI/Events of Note  Fever 101, s/p IR intervention earlier. Has PNA on abx. Cx neg so far.   eICU Interventions  - Tylenol PR ordered for now.      Intervention Category Intermediate Interventions: Other:  Ranee Gosselin 10/03/2019, 11:05 PM

## 2019-10-03 NOTE — Procedures (Signed)
Interventional Radiology Procedure Note  Procedure: Image guided drain placement, left PCN.  10F pigtail drain.  Complications: None  EBL: None Sample: Culture sent  Recommendations: - Routine drain care record output - follow up Cx - routine wound care  Signed,  Takoda Janowiak S. Tyshan Enderle, DO    

## 2019-10-03 NOTE — Progress Notes (Signed)
Referring Physician(s): Dr Jerilee Hoh  Supervising Physician: Gilmer Mor  Patient Status:  [facility] - In-pt  Chief Complaint:  Urosepsis Left sided obstructing stone  Subjective:  L PCN placed in IR yesterday- Dr Loreta Ave  Pt in ICU Resting Demented Seems comfortable   Allergies: Penicillin g and Shellfish-derived products  Medications: Prior to Admission medications   Medication Sig Start Date End Date Taking? Authorizing Provider  aspirin EC 81 MG tablet Take 81 mg by mouth daily.    [provider]  atorvastatin (LIPITOR) 80 MG tablet Take 80 mg by mouth daily. 08/03/15   [provider]  donepezil (ARICEPT) 5 MG tablet Take 5 mg by mouth at bedtime. 07/25/16   [provider]  metoprolol tartrate (LOPRESSOR) 25 MG tablet Take 12.5 mg by mouth 2 (two) times daily. 08/03/15   [provider]  Multiple Vitamins-Minerals (CENTRUM SILVER 50+WOMEN PO) Take 1 tablet by mouth daily.    [provider]  Multiple Vitamins-Minerals (PRESERVISION AREDS 2 PO) Take 2 tablets by mouth daily.    [provider]  pantoprazole (PROTONIX) 40 MG tablet Take 40 mg by mouth daily. 08/03/15   [provider]  Pseudoephedrine-Guaifenesin (MUCINEX D PO) Take by mouth every 12 (twelve) hours as needed.    [provider]  spironolactone (ALDACTONE) 25 MG tablet Take 0.5 tablets by mouth daily. 03/30/16   [provider]  vitamin B-12 (CYANOCOBALAMIN) 1000 MCG tablet Take 1 tablet by mouth daily.    [provider]     Vital Signs: BP (!) 115/51   Pulse 88   Temp 99.4 F (37.4 C) (Axillary)   Resp (!) 30   Wt 149 lb 0.5 oz (67.6 kg)   SpO2 98%   BMI 26.40 kg/m   Physical Exam Skin:    General: Skin is warm.     Comments: Site of L PCN is clean and dry NT no bleeding OP blood tinged 135 cc yesterday-- minimal in bag      Imaging: DG Chest Port 1 View  Result Date: 10/03/2019 CLINICAL DATA:   Sepsis. EXAM: PORTABLE CHEST 1 VIEW COMPARISON:  10-19-2019. FINDINGS: Stable cardiomediastinal silhouette. No pneumothorax is noted. New right upper and bilateral basilar opacities are noted concerning for multifocal pneumonia. Small pleural effusions may be present. Bony thorax is unremarkable. IMPRESSION: New right upper and bilateral basilar opacities are noted concerning for multifocal pneumonia. Small pleural effusions may be present. Electronically Signed   By: Lupita Raider M.D.   On: 10/03/2019 09:15   IR NEPHROSTOMY PLACEMENT LEFT  Result Date: 10/03/2019 INDICATION: 79 year old female with obstructing left-sided ureteral stone and urosepsis EXAM: IMAGE GUIDED PLACEMENT OF LEFT-SIDED PERCUTANEOUS NEPHROSTOMY COMPARISON:  None. MEDICATIONS: Inpatient antibiotics ANESTHESIA/SEDATION: Fentanyl 50 mcg IV; Versed 1.0 mg IV Moderate Sedation Time:  10 minutes The patient was continuously monitored during the procedure by the interventional radiology nurse under my direct supervision. CONTRAST:  10 cc-administered into the collecting system(s) FLUOROSCOPY TIME:  Fluoroscopy Time: 0 minutes 30 seconds COMPLICATIONS: None PROCEDURE: Informed written consent was obtained from the patient's family after a thorough discussion of the procedural risks, benefits and alternatives. All questions were addressed. Maximal Sterile Barrier Technique was utilized including caps, mask, sterile gowns, sterile gloves, sterile drape, hand hygiene and skin antiseptic. A timeout was performed prior to the initiation of the procedure. Patient positioned prone position on the fluoroscopy table. Ultrasound survey of the left flank was performed with images stored and  sent to PACs. The patient was then prepped and draped in the usual sterile fashion. 1% lidocaine was used to anesthetize the skin and subcutaneous tissues for local anesthesia. A Chiba needle was then used to access a posterior inferior calyx with ultrasound  guidance. With spontaneous urine returned through the needle, passage of an 018 micro wire into the collecting system was performed under fluoroscopy. A small incision was made with an 11 blade scalpel, and the needle was removed from the wire. An Accustick system was then advanced over the wire into the collecting system under fluoroscopy. The metal stiffener and inner dilator were removed, and then a sample of fluid was aspirated through the 4 French outer sheath. Bentson wire was passed into the collecting system and the sheath removed. Ten French dilation of the soft tissues was performed. Using modified Seldinger technique, a 10 French pigtail catheter drain was placed over the Bentson wire. Wire and inner stiffener removed, and the pigtail was formed in the collecting system. Small amount of contrast confirmed position of the catheter. Patient tolerated the procedure well and remained hemodynamically stable throughout. No complications were encountered and no significant blood loss encountered IMPRESSION: Status post left-sided percutaneous nephrostomy for obstructing left ureteral stone. Signed, Yvone Neu. Reyne Dumas, RPVI [facility] Radiology Electronically Signed   By: Gilmer Mor D.O.   On: 10/03/2019 01:46    Labs:  CBC: Recent Labs    10/14/2019 2246 10/03/19 0218  WBC 24.4* 18.5*  HGB 10.6* 10.3*  HCT 31.6* 31.9*  PLT 79* 76*    COAGS: Recent Labs    10/01/2019 2246  INR 1.2    BMP: Recent Labs    10/10/2019 2246 10/03/19 0218  NA 135 135  K 4.4 4.3  CL 107 107  CO2 18* 17*  GLUCOSE 82 77  BUN 59* 61*  CALCIUM 7.5* 7.5*  CREATININE 5.47* 5.44*  GFRNONAA 7* 7*  GFRAA 8* 8*    LIVER FUNCTION TESTS: Recent Labs    10/14/2019 2246 10/03/19 0218  BILITOT 0.7 0.5  AST 68* 65*  ALT 38 35  ALKPHOS 121 112  PROT 5.5* 5.2*  ALBUMIN 2.4* 2.2*    Assessment and Plan:  L PCN intact Will follow Plan per  PCCM  Electronically Signed: Robet Leu, PA-C 10/03/2019, 1:07 PM   I spent a total of 15 Minutes at the the patient's bedside AND on the patient's hospital floor or unit, greater than 50% of which was counseling/coordinating care for left PCN

## 2019-10-03 NOTE — Consult Note (Signed)
Urology Consult  Referring physician: Dr. Sherene Sires Reason for referral: left ureteral calculus, sepsis  Chief Complaint: altered mental status  History of Present Illness: Patricia Vang is a 79yo with a hx of dementia, CHF who was transferred from [facility] last night with sepsis from multiple left ureteral calculi. Patricia Vang underwent successful left nephrostomy tube placemtn after arrival. Currently Patricia Vang is in the ICU and is nonverbal. WBC count 18.5 and creatinine 5.44  Past Medical History:  Diagnosis Date   Alzheimer's dementia (HCC)    Cardiomyopathy (HCC)    ESBL (extended spectrum beta-lactamase) producing bacteria infection    Failure to thrive in adult    Hypercalcemia    Hyperparathyroidism (HCC)    Hypertensive CHF (HCC)    Mild cognitive impairment    Nephrolithiasis    Vitamin D deficiency    Past Surgical History:  Procedure Laterality Date   IR NEPHROSTOMY PLACEMENT LEFT  10/03/2019   TONSILLECTOMY      Medications: I have reviewed the patient's current medications. Allergies:  Allergies  Allergen Reactions   Penicillin G Hives   Shellfish-Derived Products     Other reaction(s): Other (See Comments) unsure    Family History  Problem Relation Age of Onset   Alzheimer's disease Father    Alzheimer's disease Daughter    Social History:  reports that Patricia Vang has never smoked. Patricia Vang has never used smokeless tobacco. Patricia Vang reports that Patricia Vang does not drink alcohol and does not use drugs.  Review of Systems  Unable to perform ROS: Mental status change    Physical Exam:  Vital signs in last 24 hours: Temp:  [97.8 F (36.6 C)-99.7 F (37.6 C)] 99.7 F (37.6 C) (07/17 0833) Pulse Rate:  [72-102] 88 (07/17 0800) Resp:  [18-34] 27 (07/17 0800) BP: (94-146)/(30-112) 115/51 (07/17 0800) SpO2:  [85 %-99 %] 99 % (07/17 0800) Weight:  [67.6 kg] 67.6 kg (07/17 0500) Physical Exam Vitals reviewed.  Constitutional:      Appearance: Patricia Vang is ill-appearing.  HENT:      Head: Normocephalic and atraumatic.     Nose: Nose normal.  Cardiovascular:     Rate and Rhythm: Regular rhythm. Tachycardia present.  Pulmonary:     Effort: Pulmonary effort is normal. No respiratory distress.  Abdominal:     General: Abdomen is flat. There is no distension.  Musculoskeletal:        General: No swelling or deformity.     Cervical back: Normal range of motion and neck supple.  Skin:    General: Skin is warm and dry.  Neurological:     Mental Status: Patricia Vang is confused.     Laboratory Data:  Results for orders placed or performed during the hospital encounter of 10/01/2019 (from the past 72 hour(s))  MRSA PCR Screening     Status: None   Collection Time: 10/11/2019  9:54 PM   Specimen: Nasal Mucosa; Nasopharyngeal  Result Value Ref Range   MRSA by PCR NEGATIVE NEGATIVE    Comment:        The GeneXpert MRSA Assay (FDA approved for NASAL specimens only), is one component of a comprehensive MRSA colonization surveillance program. It is not intended to diagnose MRSA infection nor to guide or monitor treatment for MRSA infections. Performed at [facility], [address], [place], [place] [address]   SARS Coronavirus 2 by RT PCR (hospital order, performed in [facility] hospital lab) Nasopharyngeal Nasopharyngeal Swab     Status: None   Collection Time:  09/18/2019 10:43 PM   Specimen: Nasopharyngeal Swab  Result Value Ref Range   SARS Coronavirus 2 NEGATIVE NEGATIVE    Comment: (NOTE) SARS-CoV-2 target nucleic acids are NOT DETECTED.  The SARS-CoV-2 RNA is generally detectable in upper and lower respiratory specimens during the acute phase of infection. The lowest concentration of SARS-CoV-2 viral copies this assay can detect is 250 copies / mL. A negative result does not preclude SARS-CoV-2 infection and should not be used as the sole basis for treatment or other patient management decisions.  A negative result may occur with improper  specimen collection / handling, submission of specimen other than nasopharyngeal swab, presence of viral mutation(s) within the areas targeted by this assay, and inadequate number of viral copies (<250 copies / mL). A negative result must be combined with clinical observations, patient history, and epidemiological information.  Fact Sheet for Patients:   BoilerBrush.com.cy  Fact Sheet for Healthcare Providers: https://pope.com/  This test is not yet approved or  cleared by the [place] FDA and has been authorized for detection and/or diagnosis of SARS-CoV-2 by FDA under an Emergency Use Authorization (EUA).  This EUA will remain in effect (meaning this test can be used) for the duration of the COVID-19 declaration under Section 564(b)(1) of the Act, 21 U.S.C. section 360bbb-3(b)(1), unless the authorization is terminated or revoked sooner.  Performed at [facility], [address] [place], [place] [address]   Comprehensive metabolic panel     Status: Abnormal   Collection Time: 10/03/2019 10:46 PM  Result Value Ref Range   Sodium 135 135 - 145 mmol/L   Potassium 4.4 3.5 - 5.1 mmol/L   Chloride 107 98 - 111 mmol/L   CO2 18 (L) 22 - 32 mmol/L   Glucose, Bld 82 70 - 99 mg/dL    Comment: Glucose reference range applies only to samples taken after fasting for at least 8 hours.   BUN 59 (H) 8 - 23 mg/dL   Creatinine, Ser 9.44 (H) 0.44 - 1.00 mg/dL   Calcium 7.5 (L) 8.9 - 10.3 mg/dL   Total Protein 5.5 (L) 6.5 - 8.1 g/dL   Albumin 2.4 (L) 3.5 - 5.0 g/dL   AST 68 (H) 15 - 41 U/L   ALT 38 0 - 44 U/L   Alkaline Phosphatase 121 38 - 126 U/L   Total Bilirubin 0.7 0.3 - 1.2 mg/dL   GFR calc non Af Amer 7 (L) >60 mL/min   GFR calc Af Amer 8 (L) >60 mL/min   Anion gap 10 5 - 15    Comment: Performed at [facility], [address], [place], [place] [address]  CBC     Status: Abnormal   Collection  Time: 10/05/2019 10:46 PM  Result Value Ref Range   WBC 24.4 (H) 4.0 - 10.5 K/uL   RBC 3.55 (L) 3.87 - 5.11 MIL/uL   Hemoglobin 10.6 (L) 12.0 - 15.0 g/dL   HCT 16.3 (L) 36 - 46 %   MCV 89.0 80.0 - 100.0 fL   MCH 29.9 26.0 - 34.0 pg   MCHC 33.5 30.0 - 36.0 g/dL   RDW 84.6 (H) 65.9 - 93.5 %   Platelets 79 (L) 150 - 400 K/uL    Comment: REPEATED TO VERIFY PLATELET COUNT CONFIRMED BY SMEAR SPECIMEN CHECKED FOR CLOTS Immature Platelet Fraction may be clinically indicated, consider ordering this additional test [id number]    nRBC 0.0 0.0 - 0.2 %    Comment: Performed  at [facility], [address] [place], [place] [address]  Protime-INR     Status: None   Collection Time: 10/12/2019 10:46 PM  Result Value Ref Range   Prothrombin Time 14.6 11.4 - 15.2 seconds   INR 1.2 0.8 - 1.2    Comment: (NOTE) INR goal varies based on device and disease states. Performed at [facility], [address] [place], [place] [address]   Aerobic/Anaerobic Culture (surgical/deep wound)     Status: None (Preliminary result)   Collection Time: 10/03/19  1:40 AM   Specimen: Kidney; Urine  Result Value Ref Range   Specimen Description KIDNEY    Special Requests      IMAGE GUIDED DRAIN PLACEMENT, LEFT PCN URINE FROM LEFT KIDNEY Performed at [facility], [address] [place], [place] [address]    Gram Stain PENDING    Culture PENDING    Report Status PENDING   CBC     Status: Abnormal   Collection Time: 10/03/19  2:18 AM  Result Value Ref Range   WBC 18.5 (H) 4.0 - 10.5 K/uL   RBC 3.55 (L) 3.87 - 5.11 MIL/uL   Hemoglobin 10.3 (L) 12.0 - 15.0 g/dL   HCT 95.231.9 (L) 36 - 46 %   MCV 89.9 80.0 - 100.0 fL   MCH 29.0 26.0 - 34.0 pg   MCHC 32.3 30.0 - 36.0 g/dL   RDW 84.117.2 (H) 32.411.5 - 40.115.5 %   Platelets 76 (L) 150 - 400 K/uL    Comment: REPEATED TO VERIFY PLATELET COUNT CONFIRMED BY SMEAR SPECIMEN CHECKED FOR CLOTS Immature Platelet Fraction may be clinically  indicated, consider ordering this additional test [id number]    nRBC 0.0 0.0 - 0.2 %    Comment: Performed at [facility], [address] [place], [place] [address]  Comprehensive metabolic panel     Status: Abnormal   Collection Time: 10/03/19  2:18 AM  Result Value Ref Range   Sodium 135 135 - 145 mmol/L   Potassium 4.3 3.5 - 5.1 mmol/L   Chloride 107 98 - 111 mmol/L   CO2 17 (L) 22 - 32 mmol/L   Glucose, Bld 77 70 - 99 mg/dL    Comment: Glucose reference range applies only to samples taken after fasting for at least 8 hours.   BUN 61 (H) 8 - 23 mg/dL   Creatinine, Ser 7.425.44 (H) 0.44 - 1.00 mg/dL   Calcium 7.5 (L) 8.9 - 10.3 mg/dL   Total Protein 5.2 (L) 6.5 - 8.1 g/dL   Albumin 2.2 (L) 3.5 - 5.0 g/dL   AST 65 (H) 15 - 41 U/L   ALT 35 0 - 44 U/L   Alkaline Phosphatase 112 38 - 126 U/L   Total Bilirubin 0.5 0.3 - 1.2 mg/dL   GFR calc non Af Amer 7 (L) >60 mL/min   GFR calc Af Amer 8 (L) >60 mL/min   Anion gap 11 5 - 15    Comment: Performed at [facility], [address] [place], [place] [address]   Recent Results (from the past 240 hour(s))  MRSA PCR Screening     Status: None   Collection Time: 10/04/2019  9:54 PM   Specimen: Nasal Mucosa; Nasopharyngeal  Result Value Ref Range Status   MRSA by PCR NEGATIVE NEGATIVE Final    Comment:        The GeneXpert MRSA Assay (FDA approved for NASAL specimens only), is one  component of a comprehensive MRSA colonization surveillance program. It is not intended to diagnose MRSA infection nor to guide or monitor treatment for MRSA infections. Performed at [facility], [address], [place], [place] [address]   SARS Coronavirus 2 by RT PCR (hospital order, performed in [facility] hospital lab) Nasopharyngeal Nasopharyngeal Swab     Status: None   Collection Time: 03-Oct-2019 10:43 PM   Specimen: Nasopharyngeal Swab  Result Value Ref Range Status   SARS Coronavirus 2  NEGATIVE NEGATIVE Final    Comment: (NOTE) SARS-CoV-2 target nucleic acids are NOT DETECTED.  The SARS-CoV-2 RNA is generally detectable in upper and lower respiratory specimens during the acute phase of infection. The lowest concentration of SARS-CoV-2 viral copies this assay can detect is 250 copies / mL. A negative result does not preclude SARS-CoV-2 infection and should not be used as the sole basis for treatment or other patient management decisions.  A negative result may occur with improper specimen collection / handling, submission of specimen other than nasopharyngeal swab, presence of viral mutation(s) within the areas targeted by this assay, and inadequate number of viral copies (<250 copies / mL). A negative result must be combined with clinical observations, patient history, and epidemiological information.  Fact Sheet for Patients:   BoilerBrush.com.cy  Fact Sheet for Healthcare Providers: https://pope.com/  This test is not yet approved or  cleared by the [place] FDA and has been authorized for detection and/or diagnosis of SARS-CoV-2 by FDA under an Emergency Use Authorization (EUA).  This EUA will remain in effect (meaning this test can be used) for the duration of the COVID-19 declaration under Section 564(b)(1) of the Act, 21 U.S.C. section 360bbb-3(b)(1), unless the authorization is terminated or revoked sooner.  Performed at [facility], [address], [place], [place] [address]   Aerobic/Anaerobic Culture (surgical/deep wound)     Status: None (Preliminary result)   Collection Time: 10/03/19  1:40 AM   Specimen: Kidney; Urine  Result Value Ref Range Status   Specimen Description KIDNEY  Final   Special Requests   Final    IMAGE GUIDED DRAIN PLACEMENT, LEFT PCN URINE FROM LEFT KIDNEY Performed at [facility], [address] [place], [place] [address]    Gram Stain PENDING   Incomplete   Culture PENDING  Incomplete   Report Status PENDING  Incomplete   Creatinine: Recent Labs    2019-10-03 2246 10/03/19 0218  CREATININE 5.47* 5.44*   Baseline Creatinine: unknwon  Impression/Assessment:  79yo with left ureteral calculi and sepsis from a urinary source  Plan:  No further urologic intervention at this time. Management per ICU team. The patient will need ureteroscopy with stone extraction after 2 week of culture specific antibiotics and pending resolution of her sepsis.  Wilkie Aye 10/03/2019, 10:14 AM

## 2019-10-03 NOTE — Progress Notes (Signed)
NAME:  Patricia Vang, MRN:  [id number], DOB:  14-Apr-1940, LOS: 1 ADMISSION DATE:  October 04, 2019, CONSULTATION DATE:  7/16 REFERRING MD:  Duke Salvia, CHIEF COMPLAINT:  Sepsis, hydronephrosis   Brief History   79yowf never smoker  non-verbal female with hx dementia, CHF with recent UTI treated with bactrim x 2 days PTA  but presented to [facility] 7/16 with worsening mental status, less mobile at home.  Hypotensive on arrival with SBP 90's, AKI with Scr 5.5, lactate 4.5.   CT abd revealed moderate L hydronephrosis extending down to a 1.0 cm proximal ureteral calculus and just proximal to this is a 0.6 cm proximal uteral calculus. UA opaque and brown with 1+ protein, 3+ blood, 3+ leuks. She was tx to [facility] for ICU care and IR/urology.       Past Medical History   has a past medical history of Alzheimer's dementia (HCC), Cardiomyopathy (HCC), ESBL (extended spectrum beta-lactamase) producing bacteria infection, Failure to thrive in adult, Hypercalcemia, Hyperparathyroidism (HCC), Hypertensive CHF (HCC), Mild cognitive impairment, Nephrolithiasis, and Vitamin D deficiency.   Significant Hospital Events   7/16 Transferred from [facility] to [facility] 7/17  Left  PCN by IR  IR  Consults:  IR urology  Procedures:  R IJ CVL ([place]) 7/16>>>  Significant Diagnostic Tests:  CT abd/pelvis ([place])>>>CT abd revealed moderate L hydronephrosis extending down to a 1.0 cm proximal ureteral calculus and just proximal to this is a 0.6 cm proximal uteral calculus.   Micro Data:  Covid-19 PCR  7/16 neg  MRSA  PCR     7/16 neg  Urine 7/16>>> BC x 2 7/16>>> PCN 7/17 >>>   Antimicrobials:  Ceftriaxone 7/16>>>   Interim history/subjective:  Off pressors, nad though bp borderline/ cvp requested   Objective   Blood pressure (!) 125/44, pulse 95, temperature 97.8 F (36.6 C), temperature source Axillary, resp. rate (!) 25, weight 67.6 kg, SpO2 97 %.        Intake/Output Summary (Last  24 hours) at 10/03/2019 0807 Last data filed at 10/03/2019 0636 Gross per 24 hour  Intake --  Output 335 ml  Net -335 ml   Filed Weights   10/03/19 0500  Weight: 67.6 kg    Physical Exam: Tmax 97.8/  Nasal 02 2lpm  Pt appears  alert, non verabal / hob up 30 degrees  No jvd Oropharynx clear,  mucosa nl Neck supple Lungs with a few scattered exp > insp rhonchi bilaterally RRR no s3 or or sign murmur Abd soft/ nl  excursion / L PCN in place bloody drainage  Extr cool  with no edema or clubbing noted - PAS in place Neuro moves all ext [facility] Problem list     Assessment & Plan:   Septic shock - in setting UTI/pyelonephritis with hydronephrosis.  PLAN -  IV fluids  Continue ceftriaxone Trend lactate Check cvp to guide fluids       AKI  - sp L PCN am 7/17 Lab Results  Component Value Date   CREATININE 5.44 (H) 10/03/2019   CREATININE 5.47 (H) 2019-10-04    PLAN -  Since K is ok and hc03 trending down a bit will change to D5LR at 100 cc/ h pending cvp check      Thrombocytpenia ? Baseline Lab Results  Component Value Date   PLT 76 (L) 10/03/2019   PLT 79 (L) 2019/10/04    C/w sepsis/ monitor for now   Hx  dementia - nonverbal at baseline/ at risk TME  PLAN -  Supportive care   Best practice:  Diet: advance to clear liquids if tol sips/chips  Pain/Anxiety/Delirium protocol (if indicated): n/a VAP protocol (if indicated): n/a DVT prophylaxis: PAS  GI prophylaxis: n/a Glucose control: n/a Mobility: BR Code Status: Full code Family CommunicationJethro Poling, patient's son and HCPOA [phone]) Disposition: ICU  Labs   CBC: Recent Labs  Lab 10/11/2019 2246 10/03/19 0218  WBC 24.4* 18.5*  HGB 10.6* 10.3*  HCT 31.6* 31.9*  MCV 89.0 89.9  PLT 79* 76*    Basic Metabolic Panel: Recent Labs  Lab 11-Oct-2019 2246 10/03/19 0218  NA 135 135  K 4.4 4.3  CL 107 107  CO2 18* 17*  GLUCOSE 82 77  BUN 59* 61*  CREATININE 5.47* 5.44*   CALCIUM 7.5* 7.5*   GFR: CrCl cannot be calculated (Unknown ideal weight.). Recent Labs  Lab 2019-10-11 2246 10/03/19 0218  WBC 24.4* 18.5*    Liver Function Tests: Recent Labs  Lab October 11, 2019 2246 10/03/19 0218  AST 68* 65*  ALT 38 35  ALKPHOS 121 112  BILITOT 0.7 0.5  PROT 5.5* 5.2*  ALBUMIN 2.4* 2.2*   No results for input(s): LIPASE, AMYLASE in the last 168 hours. No results for input(s): AMMONIA in the last 168 hours.  ABG No results found for: PHART, PCO2ART, PO2ART, HCO3, TCO2, ACIDBASEDEF, O2SAT   Coagulation Profile: Recent Labs  Lab 2019-10-11 2246  INR 1.2    Cardiac Enzymes: No results for input(s): CKTOTAL, CKMB, CKMBINDEX, TROPONINI in the last 168 hours.  HbA1C: No results found for: HGBA1C  CBG: No results for input(s): GLUCAP in the last 168 hours.   The patient is critically ill with multiple organ systems failure and requires high complexity decision making for assessment and support, frequent evaluation and titration of therapies, application of advanced monitoring technologies and extensive interpretation of multiple databases. Critical Care Time devoted to patient care services described in this note is 35 minutes .   Sandrea Hughs, MD Pulmonary and Critical Care Medicine [facility] Cell [phone] After 6:00 PM or weekends, use Beeper [phone]  After 7:00 pm call Elink  [phone]

## 2019-10-03 NOTE — Progress Notes (Addendum)
Interventional Radiology Pre-Procedure Note   79 yo female with urosepsis, left sided obstructing stone.   Hyptension with arrival SBP 90's reported.  Range 111-145 HR 87 -99 WBC: 24.4 Platelets: 79 Cr: 5.47 BUN: 59 COVID neg  Currently on pressors/levo  VIR called for left PCN.   Patient unable to consent for herself.  I have spoken with her husband regarding consent, and I have been unable to contact the patient's son, who is the reported POA.   Discussed with Dr. Valora Piccolo, of [facility], and we will plan on proceeding with PCN, given the patient's status.   Signed,  Yvone Neu. Loreta Ave, DO

## 2019-10-03 NOTE — Progress Notes (Signed)
eLink Physician-Brief Progress Note Patient Name: Patricia Vang DOB: 1940-03-26 MRN: [id number]   Date of Service  10/03/2019  HPI/Events of Note  Coughing darkish red sputum.  Camera: Hemodynamically stable. sats fine. RR < 16. Dementia.   CxR: Rt multilobar PNA.  No cavity   eICU Interventions  Hemoptysis from asp PNA, stable thrombocytopenia at 77K. Not on blood thinner. Small amount sputum. Watch for now. Call back if worsens.  Discussed with bed side RN.      Intervention Category Intermediate Interventions: Bleeding - evaluation and treatment with blood products  Ranee Gosselin 10/03/2019, 10:13 PM

## 2019-10-04 ENCOUNTER — Inpatient Hospital Stay (HOSPITAL_COMMUNITY): Payer: No Typology Code available for payment source

## 2019-10-04 DIAGNOSIS — J9601 Acute respiratory failure with hypoxia: Secondary | ICD-10-CM | POA: Diagnosis present

## 2019-10-04 DIAGNOSIS — E872 Acidosis: Secondary | ICD-10-CM

## 2019-10-04 DIAGNOSIS — E8729 Other acidosis: Secondary | ICD-10-CM | POA: Diagnosis not present

## 2019-10-04 DIAGNOSIS — R0603 Acute respiratory distress: Secondary | ICD-10-CM

## 2019-10-04 DIAGNOSIS — D696 Thrombocytopenia, unspecified: Secondary | ICD-10-CM

## 2019-10-04 DIAGNOSIS — N179 Acute kidney failure, unspecified: Secondary | ICD-10-CM

## 2019-10-04 LAB — BLOOD GAS, ARTERIAL
Acid-base deficit: 7.3 mmol/L — ABNORMAL HIGH (ref 0.0–2.0)
Bicarbonate: 16.2 mmol/L — ABNORMAL LOW (ref 20.0–28.0)
FIO2: 100
O2 Saturation: 91.3 %
Patient temperature: 98.7
pCO2 arterial: 28.3 mmHg — ABNORMAL LOW (ref 32.0–48.0)
pH, Arterial: 7.376 (ref 7.350–7.450)
pO2, Arterial: 60 mmHg — ABNORMAL LOW (ref 83.0–108.0)

## 2019-10-04 LAB — CBC
HCT: 34 % — ABNORMAL LOW (ref 36.0–46.0)
Hemoglobin: 11.4 g/dL — ABNORMAL LOW (ref 12.0–15.0)
MCH: 29.4 pg (ref 26.0–34.0)
MCHC: 33.5 g/dL (ref 30.0–36.0)
MCV: 87.6 fL (ref 80.0–100.0)
Platelets: 77 10*3/uL — ABNORMAL LOW (ref 150–400)
RBC: 3.88 MIL/uL (ref 3.87–5.11)
RDW: 17.6 % — ABNORMAL HIGH (ref 11.5–15.5)
WBC: 20.4 10*3/uL — ABNORMAL HIGH (ref 4.0–10.5)
nRBC: 0 % (ref 0.0–0.2)

## 2019-10-04 LAB — BASIC METABOLIC PANEL
BUN: 64 mg/dL — ABNORMAL HIGH (ref 8–23)
CO2: <7 mmol/L — ABNORMAL LOW (ref 22–32)
Calcium: 8.3 mg/dL — ABNORMAL LOW (ref 8.9–10.3)
Chloride: 109 mmol/L (ref 98–111)
Creatinine, Ser: 6.02 mg/dL — ABNORMAL HIGH (ref 0.44–1.00)
GFR calc Af Amer: 7 mL/min — ABNORMAL LOW (ref >60)
GFR calc non Af Amer: 6 mL/min — ABNORMAL LOW (ref >60)
Glucose, Bld: 140 mg/dL — ABNORMAL HIGH (ref 70–99)
Potassium: 4.3 mmol/L (ref 3.5–5.1)
Sodium: 139 mmol/L (ref 135–145)

## 2019-10-04 MED ORDER — FUROSEMIDE 10 MG/ML IJ SOLN
40.0000 mg | Freq: Once | INTRAMUSCULAR | Status: AC
  Administered 2019-10-04: 40 mg via INTRAVENOUS
  Filled 2019-10-04: qty 4

## 2019-10-04 MED ORDER — SODIUM CHLORIDE 0.9% FLUSH: 10.0000 mL | INTRAVENOUS | Status: DC | PRN

## 2019-10-04 MED ORDER — SODIUM BICARBONATE 8.4 % IV SOLN
INTRAVENOUS | Status: DC
  Filled 2019-10-04: qty 150

## 2019-10-04 MED ORDER — FUROSEMIDE 10 MG/ML IJ SOLN
INTRAMUSCULAR | Status: AC
  Administered 2019-10-04: 40 mg
  Filled 2019-10-04: qty 4

## 2019-10-04 MED ORDER — SODIUM BICARBONATE-DEXTROSE 150-5 MEQ/L-% IV SOLN
150.0000 meq | INTRAVENOUS | Status: DC
  Administered 2019-10-04 – 2019-10-05 (×4): 150 meq via INTRAVENOUS
  Filled 2019-10-04 (×5): qty 1000

## 2019-10-04 MED ORDER — ORAL CARE MOUTH RINSE
15.0000 mL | Freq: Two times a day (BID) | OROMUCOSAL | Status: DC
  Administered 2019-10-04 – 2019-10-06 (×5): 15 mL via OROMUCOSAL

## 2019-10-04 MED ORDER — SODIUM CHLORIDE 0.9 % IV SOLN
1.0000 g | INTRAVENOUS | Status: DC
  Administered 2019-10-05 – 2019-10-06 (×2): 1 g via INTRAVENOUS
  Filled 2019-10-04 (×2): qty 1

## 2019-10-04 MED ORDER — SODIUM CHLORIDE 0.9% FLUSH
10.0000 mL | Freq: Two times a day (BID) | INTRAVENOUS | Status: DC
  Administered 2019-10-04: 10 mL
  Administered 2019-10-04: 6 mL
  Administered 2019-10-05 (×2): 10 mL
  Administered 2019-10-06: 20 mL
  Administered 2019-10-07: 10 mL

## 2019-10-04 MED ORDER — SODIUM BICARBONATE 8.4 % IV SOLN: INTRAVENOUS | Status: DC

## 2019-10-04 MED ORDER — MORPHINE SULFATE (PF) 2 MG/ML IV SOLN
0.5000 mg | INTRAVENOUS | Status: DC | PRN
  Administered 2019-10-04 – 2019-10-05 (×3): 0.5 mg via INTRAVENOUS
  Filled 2019-10-04 (×4): qty 1

## 2019-10-04 MED ORDER — SODIUM CHLORIDE 0.9 % IV SOLN
1.0000 g | Freq: Once | INTRAVENOUS | Status: AC
  Administered 2019-10-04: 1 g via INTRAVENOUS
  Filled 2019-10-04: qty 1

## 2019-10-04 NOTE — Consult Note (Addendum)
Medical Consultation   Patricia Vang  ZOX:[id number]  DOB: [birth year]  DOA: 10/04/2019  PCP: Everlean CherryWhyte, Thomas M, MD   Requesting physician: Dr. Marlane MingleMohan (PCCM)   Reason for consultation:  Hemoptysis, respiratory distress    History of Present Illness: Patricia Mae Lum KeasHilton Vang is an 79 y.o. female with history of Alzheimer, cardiogram, ESBL infections, and was treated for UTI with Bactrim for 2 days when she [facility] on 10/02/2018 with lethargy and hypotension, remained hypotensive following fluid resuscitation, was started on Levophed, and admitted to the critical care service with septic shock suspected secondary to UTI and AKI with hydronephrosis and left ureteral calculi.   COVID PCR was negative on 7/16.   She was cultured and started on Rocephin, she was seen bu urology, and nephrostomy tube was placed by IR.   She was on room air initially with RR teens to mid-20s, requiring 2 Lpm yesterday morning. By yesterday evening, she was tachypneic in mid to upper 20s and requiring 4 Lpm supplemental O2.  CXR findings from yesterday were concerning for multifocal PNA. She developed hemoptysis and respiratory distress overnight and PCCM asked the hospitalist to go assess.    Review of Systems:  ROS As per HPI otherwise 10 point review of systems negative.     Past Medical History: Past Medical History:  Diagnosis Date  . Alzheimer's dementia (HCC)   . Cardiomyopathy (HCC)   . ESBL (extended spectrum beta-lactamase) producing bacteria infection   . Failure to thrive in adult   . Hypercalcemia   . Hyperparathyroidism (HCC)   . Hypertensive CHF (HCC)   . Mild cognitive impairment   . Nephrolithiasis   . Vitamin D deficiency     Past Surgical History: Past Surgical History:  Procedure Laterality Date  . IR NEPHROSTOMY PLACEMENT LEFT  10/03/2019  . TONSILLECTOMY       Allergies:   Allergies  Allergen Reactions  .  Penicillin G Hives  . Shellfish-Derived Products Anaphylaxis     Social History:  reports that she has never smoked. She has never used smokeless tobacco. She reports that she does not drink alcohol and does not use drugs.   Family History: Family History  Problem Relation Age of Onset  . Alzheimer's disease Father   . Alzheimer's disease Daughter      Physical Exam: Vitals:   10/03/19 2130 10/03/19 2200 10/04/19 0000 10/04/19 0337  BP: (!) 124/42 (!) 140/55    Pulse: 98 91    Resp: (!) 24 (!) 31    Temp:   98.8 F (37.1 C)   TempSrc:   Oral   SpO2: 95% 94%  (!) 79%  Weight:        Constitutional:  in respiratory distress, non-verbal, no diaphoresis. CVS: Rate ~110 and regular   Respiratory:  Coarse rhonchi bilaterally, tachypneic, no cyanosis.   Data reviewed:  I have personally reviewed following labs and imaging studies Labs:  CBC: Recent Labs  Lab 10/17/2019 2246 10/03/19 0218  WBC 24.4* 18.5*  HGB 10.6* 10.3*  HCT 31.6* 31.9*  MCV 89.0 89.9  PLT 79* 76*    Basic Metabolic Panel: Recent Labs  Lab 10/01/2019 2246 10/03/19 0218  NA 135 135  K 4.4 4.3  CL 107 107  CO2 18* 17*  GLUCOSE 82 77  BUN 59* 61*  CREATININE 5.47* 5.44*  CALCIUM 7.5* 7.5*  GFR CrCl cannot be calculated (Unknown ideal weight.). Liver Function Tests: Recent Labs  Lab 09/29/2019 2246 10/03/19 0218  AST 68* 65*  ALT 38 35  ALKPHOS 121 112  BILITOT 0.7 0.5  PROT 5.5* 5.2*  ALBUMIN 2.4* 2.2*   No results for input(s): LIPASE, AMYLASE in the last 168 hours. No results for input(s): AMMONIA in the last 168 hours. Coagulation profile Recent Labs  Lab 10/05/2019 2246  INR 1.2    Cardiac Enzymes: No results for input(s): CKTOTAL, CKMB, CKMBINDEX, TROPONINI in the last 168 hours. BNP: Invalid input(s): POCBNP CBG: No results for input(s): GLUCAP in the last 168 hours. D-Dimer No results for input(s): DDIMER in the last 72 hours. Hgb A1c No results for input(s): HGBA1C  in the last 72 hours. Lipid Profile No results for input(s): CHOL, HDL, LDLCALC, TRIG, CHOLHDL, LDLDIRECT in the last 72 hours. Thyroid function studies No results for input(s): TSH, T4TOTAL, T3FREE, THYROIDAB in the last 72 hours.  Invalid input(s): FREET3 Anemia work up No results for input(s): VITAMINB12, FOLATE, FERRITIN, TIBC, IRON, RETICCTPCT in the last 72 hours. Urinalysis No results found for: COLORURINE, APPEARANCEUR, LABSPEC, PHURINE, GLUCOSEU, HGBUR, BILIRUBINUR, KETONESUR, PROTEINUR, UROBILINOGEN, NITRITE, LEUKOCYTESUR   Microbiology Recent Results (from the past 240 hour(s))  MRSA PCR Screening     Status: None   Collection Time: 09/23/2019  9:54 PM   Specimen: Nasal Mucosa; Nasopharyngeal  Result Value Ref Range Status   MRSA by PCR NEGATIVE NEGATIVE Final    Comment:        The GeneXpert MRSA Assay (FDA approved for NASAL specimens only), is one component of a comprehensive MRSA colonization surveillance program. It is not intended to diagnose MRSA infection nor to guide or monitor treatment for MRSA infections. Performed at [facility], [address], [place], [place] [address]   SARS Coronavirus 2 by RT PCR (hospital order, performed in [facility] hospital lab) Nasopharyngeal Nasopharyngeal Swab     Status: None   Collection Time: 10/12/2019 10:43 PM   Specimen: Nasopharyngeal Swab  Result Value Ref Range Status   SARS Coronavirus 2 NEGATIVE NEGATIVE Final    Comment: (NOTE) SARS-CoV-2 target nucleic acids are NOT DETECTED.  The SARS-CoV-2 RNA is generally detectable in upper and lower respiratory specimens during the acute phase of infection. The lowest concentration of SARS-CoV-2 viral copies this assay can detect is 250 copies / mL. A negative result does not preclude SARS-CoV-2 infection and should not be used as the sole basis for treatment or other patient management decisions.  A negative result may occur with improper specimen  collection / handling, submission of specimen other than nasopharyngeal swab, presence of viral mutation(s) within the areas targeted by this assay, and inadequate number of viral copies (<250 copies / mL). A negative result must be combined with clinical observations, patient history, and epidemiological information.  Fact Sheet for Patients:   BoilerBrush.com.cy  Fact Sheet for Healthcare Providers: https://pope.com/  This test is not yet approved or  cleared by the [place] FDA and has been authorized for detection and/or diagnosis of SARS-CoV-2 by FDA under an Emergency Use Authorization (EUA).  This EUA will remain in effect (meaning this test can be used) for the duration of the COVID-19 declaration under Section 564(b)(1) of the Act, 21 U.S.C. section 360bbb-3(b)(1), unless the authorization is terminated or revoked sooner.  Performed at [facility], [address] [place], [place] [address]   Aerobic/Anaerobic Culture (surgical/deep wound)  Status: None (Preliminary result)   Collection Time: 10/03/19  1:40 AM   Specimen: Kidney; Urine  Result Value Ref Range Status   Specimen Description KIDNEY  Final   Special Requests   Final    IMAGE GUIDED DRAIN PLACEMENT, LEFT PCN URINE FROM LEFT KIDNEY   Gram Stain   Final    ABUNDANT WBC PRESENT, PREDOMINANTLY PMN FEW GRAM NEGATIVE RODS Performed at [facility], [address] [place], [place] [address]    Culture PENDING  Incomplete   Report Status PENDING  Incomplete       Inpatient Medications:   Scheduled Meds: . Chlorhexidine Gluconate Cloth  6 each Topical Q0600  . furosemide  40 mg Intravenous Once   Continuous Infusions: . sodium chloride 250 mL (10/03/19 0937)  . cefTRIAXone (ROCEPHIN)  IV Stopped (10/03/19 2220)  . dextrose 5% lactated ringers 100 mL/hr at 10/03/19 0936     Radiological Exams on Admission: DG Chest Port  1 View  Result Date: 10/03/2019 CLINICAL DATA:  79 year old female with displacement of central venous catheter. EXAM: PORTABLE CHEST 1 VIEW COMPARISON:  Chest radiograph dated 10/03/2019. FINDINGS: Right IJ central venous line in similar position with tip over central SVC close to the cavoatrial junction. Interval worsening of bilateral pulmonary opacities compared to the earlier radiograph which may be partly related to shallow inspiration. No pneumothorax. Stable cardiomediastinal silhouette. No acute osseous pathology. IMPRESSION: Right IJ central venous line with tip over central SVC. No pneumothorax. Electronically Signed   By: Elgie Collard M.D.   On: 10/03/2019 21:13   DG Chest Port 1 View  Result Date: 10/03/2019 CLINICAL DATA:  Sepsis. EXAM: PORTABLE CHEST 1 VIEW COMPARISON:  10/15/19. FINDINGS: Stable cardiomediastinal silhouette. No pneumothorax is noted. New right upper and bilateral basilar opacities are noted concerning for multifocal pneumonia. Small pleural effusions may be present. Bony thorax is unremarkable. IMPRESSION: New right upper and bilateral basilar opacities are noted concerning for multifocal pneumonia. Small pleural effusions may be present. Electronically Signed   By: Lupita Raider M.D.   On: 10/03/2019 09:15   IR NEPHROSTOMY PLACEMENT LEFT  Result Date: 10/03/2019 INDICATION: 79 year old female with obstructing left-sided ureteral stone and urosepsis EXAM: IMAGE GUIDED PLACEMENT OF LEFT-SIDED PERCUTANEOUS NEPHROSTOMY COMPARISON:  None. MEDICATIONS: Inpatient antibiotics ANESTHESIA/SEDATION: Fentanyl 50 mcg IV; Versed 1.0 mg IV Moderate Sedation Time:  10 minutes The patient was continuously monitored during the procedure by the interventional radiology nurse under my direct supervision. CONTRAST:  10 cc-administered into the collecting system(s) FLUOROSCOPY TIME:  Fluoroscopy Time: 0 minutes 30 seconds COMPLICATIONS: None PROCEDURE: Informed written consent was  obtained from the patient's family after a thorough discussion of the procedural risks, benefits and alternatives. All questions were addressed. Maximal Sterile Barrier Technique was utilized including caps, mask, sterile gowns, sterile gloves, sterile drape, hand hygiene and skin antiseptic. A timeout was performed prior to the initiation of the procedure. Patient positioned prone position on the fluoroscopy table. Ultrasound survey of the left flank was performed with images stored and sent to PACs. The patient was then prepped and draped in the usual sterile fashion. 1% lidocaine was used to anesthetize the skin and subcutaneous tissues for local anesthesia. A Chiba needle was then used to access a posterior inferior calyx with ultrasound guidance. With spontaneous urine returned through the needle, passage of an 018 micro wire into the collecting system was performed under fluoroscopy. A small incision was made with an 11 blade scalpel, and the  needle was removed from the wire. An Accustick system was then advanced over the wire into the collecting system under fluoroscopy. The metal stiffener and inner dilator were removed, and then a sample of fluid was aspirated through the 4 French outer sheath. Bentson wire was passed into the collecting system and the sheath removed. Ten French dilation of the soft tissues was performed. Using modified Seldinger technique, a 10 French pigtail catheter drain was placed over the Bentson wire. Wire and inner stiffener removed, and the pigtail was formed in the collecting system. Small amount of contrast confirmed position of the catheter. Patient tolerated the procedure well and remained hemodynamically stable throughout. No complications were encountered and no significant blood loss encountered IMPRESSION: Status post left-sided percutaneous nephrostomy for obstructing left ureteral stone. Signed, Yvone Neu. Reyne Dumas, RPVI [facility]  [facility] Electronically Signed   By: Gilmer Mor D.O.   On: 10/03/2019 01:46    Impression/Recommendations  1. Acute respiratory distress; acute hypoxic respiratory failure; multifocal PNA; CHF; hemoptysis   - CXR findings concerning for multifocal PNA, has been on Rocephin for UTI, COVID and MRSA pcr were negative, she has hx of CHF per notes, unknown EF  - She will be suctioned, given Lasix, antibiotics changed to meropenem as below, not a candidate for BiPAP per RT   2. Septic shock secondary to UTI with ureteral obstruction; ?hx of ESBL  - Admitted with septic shock secondary to UTI with ureteral stone, s/p nephrostomy tube, blood culture NGTD, has hx of ESBL per telephone note from 10/06/2019  - With worsening condition and hx of ESBL, would switch Rocephin to meropenem pending cultures     Briscoe Deutscher M.D. [facility] 10/04/2019, 4:16 AM

## 2019-10-04 NOTE — Progress Notes (Addendum)
eLink Physician-Brief Progress Note Patient Name: Patricia Vang DOB: Jan 25, 1941 MRN: [id number]   Date of Service  10/04/2019  HPI/Events of Note  Notified of patient complaining of non localizing pain. Patient seen grimacing. Also with blood tinged phlegm.  eICU Interventions  Ordered low dose morphine and discussed with bedside RN to use judiciously given renal insufficiency and borderline BP  MAP 48 after one dose of morphine. Start norepinephrine.     Intervention Category Intermediate Interventions: Pain - evaluation and management  Darl Pikes 10/04/2019, 10:59 PM

## 2019-10-04 NOTE — Progress Notes (Signed)
SLP Cancellation Note  Patient Details Name: Raushanah Osmundson MRN: [id number] DOB: 20-Aug-1940   Cancelled treatment:       Reason Eval/Treat Not Completed: Other (comment) (spoke to RN, pt's RR in 30's and she is congested, will defer follow up until tomorrow, thanks)  Rolena Infante, MS [facility] SLP Acute Rehab Services Office [phone]  Chales Abrahams 10/04/2019, 10:57 AM

## 2019-10-04 NOTE — Progress Notes (Addendum)
NAME:  Patricia Vang, MRN:  [id number], DOB:  [birth year], LOS: 2 ADMISSION DATE:  10/11/2019, CONSULTATION DATE:  7/16 REFERRING MD:  Oval Linsey, CHIEF COMPLAINT:  Sepsis, hydronephrosis   Brief History   79yowf never smoker  non-verbal female with hx dementia, CHF with recent UTI treated with bactrim x 2 days PTA  but presented to [facility] 7/16 with worsening mental status, less mobile at home.  Hypotensive on arrival with SBP 90's, AKI with Scr 5.5, lactate 4.5.   CT abd revealed moderate L hydronephrosis extending down to a 1.0 cm proximal ureteral calculus and just proximal to this is a 0.6 cm proximal uteral calculus. UA opaque and brown with 1+ protein, 3+ blood, 3+ leuks. She was tx to [facility] for ICU care and IR/urology.    Fm made NCB  Pm 7/17 as per  her previously documented wishes.     Past Medical History   has a past medical history of Alzheimer's dementia ([place]), Cardiomyopathy ([place]), ESBL (extended spectrum beta-lactamase) producing bacteria infection, Failure to thrive in adult, Hypercalcemia, Hyperparathyroidism ([place]), Hypertensive CHF ([place]), Mild cognitive impairment, Nephrolithiasis, and Vitamin D deficiency.   Significant Hospital Events   7/16 Transferred from [place] to [facility] 7/17  Left  PCN by IR  IR   Consults:  IR urology  Procedures:  R IJ CVL ([place]) 7/16>>>  Significant Diagnostic Tests:  CT abd/pelvis ([place])>>>CT abd revealed moderate L hydronephrosis extending down to a 1.0 cm proximal ureteral calculus and just proximal to this is a 0.6 cm proximal uteral calculus.   Micro Data:  Covid-19 PCR  7/16 neg  MRSA  PCR     7/16 neg  Urine 7/16>>> BC x 2 7/16>>> PCN 7/17 >>> few gnr >>>   Antimicrobials:  Ceftriaxone 7/16- 7/18 Meropenem  7/18 >>>  Scheduled Meds: . Chlorhexidine Gluconate Cloth  6 each Topical Q0600  . mouth rinse  15 mL Mouth Rinse BID  . sodium chloride flush  10-40 mL Intracatheter Q12H   Continuous  Infusions: . sodium chloride 250 mL (10/03/19 0937)  . dextrose 5% lactated ringers 100 mL/hr at 10/04/19 0506  . [START ON 10/05/2019] meropenem (MERREM) IV     PRN Meds:.acetaminophen, albuterol, docusate sodium, polyethylene glycol, sodium chloride flush   Interim history/subjective:   non verbal, moderate increased wob with lots of upper airway coarse breathing with secretions turning bloody overnight and higher WOB / 02 req noted  Objective   Blood pressure (!) 115/44, pulse 83, temperature 98.1 F (36.7 C), temperature source Axillary, resp. rate (!) 21, weight 67.6 kg, SpO2 100 %. CVP:  [6 mmHg-10 mmHg] 10 mmHg  FiO2 (%):  [100 %] 100 %   Intake/Output Summary (Last 24 hours) at 10/04/2019 0916 Last data filed at 10/04/2019 0600 Gross per 24 hour  Intake 120 ml  Output 1050 ml  Net -930 ml   Filed Weights   10/03/19 0500 10/04/19 0500  Weight: 67.6 kg 67.6 kg     Physical Exam: Tmax 101   on 100% NRM  No jvd Lungs with a junky insp/exp  rhonchi bilaterally RRR no s3 or or sign murmur Abd soft with nl excursion / L PCN less bloody drainage  Extr cool with no edema or clubbing noted      I personally reviewed images and agree with radiology impression as follows:  CXR:   Portable 7/18 1. No significant interval change in extensive right greater than left pulmonary opacities, concerning for multifocal  pneumonia. A degree of underlying pulmonary interstitial edema is suspected. 2. Probable small bilateral pleural effusions.     Resolved Hospital Problem list     Assessment & Plan:   Septic shock - in setting UTI/pyelonephritis with hydronephrosis/ worsening AG met acidosis 7/17  PLAN -  Continue IV fluids - she is on dry side but that is best for now given tenuous resp status  Changed to Meropenem to cover for ESBL pending urine culture  Changed fluids to contain HC03 am 7/17  NCB - no plan to escalate care beyond 02 / fluids/ abx   Acute hypoxemic resp  failure with diffuse infiltrates on cxr in setting of low CVP = HCAP vs ALI from sepsis >>> supplemental 02/ DNI        AKI  - sp L PCN am 7/17 Lab Results  Component Value Date   CREATININE 6.02 (H) 10/04/2019   CREATININE 5.44 (H) 10/03/2019   CREATININE 5.47 (H) 09/24/2019    PLAN - Supportive rx only/ no plans for HD or CVVH in this setting         Thrombocytpenia ? Baseline Lab Results  Component Value Date   PLT 77 (L) 10/04/2019   PLT 76 (L) 10/03/2019   PLT 79 (L) 10/17/2019    C/w sepsis/ monitor for now    Hx dementia - nonverbal at baseline/ at risk TME  PLAN -  Supportive care   Best practice:  Diet: advance to clear liquids if tol sips/chips  Pain/Anxiety/Delirium protocol (if indicated): n/a VAP protocol (if indicated): n/a DVT prophylaxis: PAS  GI prophylaxis: n/a Glucose control: n/a Mobility: BR Code Status: Full code Family Communication: Joycie Peek, patient's son and HCPOA [phone]) Disposition: ICU  Labs   CBC: Recent Labs  Lab 09/22/2019 2246 10/03/19 0218 10/04/19 0400  WBC 24.4* 18.5* 20.4*  HGB 10.6* 10.3* 11.4*  HCT 31.6* 31.9* 34.0*  MCV 89.0 89.9 87.6  PLT 79* 76* 77*    Basic Metabolic Panel: Recent Labs  Lab 10/09/2019 2246 10/03/19 0218 10/04/19 0400  NA 135 135 139  K 4.4 4.3 4.3  CL 107 107 109  CO2 18* 17* <7*  GLUCOSE 82 77 140*  BUN 59* 61* 64*  CREATININE 5.47* 5.44* 6.02*  CALCIUM 7.5* 7.5* 8.3*   GFR: CrCl cannot be calculated (Unknown ideal weight.). Recent Labs  Lab 10/17/2019 2246 10/03/19 0218 10/04/19 0400  WBC 24.4* 18.5* 20.4*    Liver Function Tests: Recent Labs  Lab 09/29/2019 2246 10/03/19 0218  AST 68* 65*  ALT 38 35  ALKPHOS 121 112  BILITOT 0.7 0.5  PROT 5.5* 5.2*  ALBUMIN 2.4* 2.2*   No results for input(s): LIPASE, AMYLASE in the last 168 hours. No results for input(s): AMMONIA in the last 168 hours.  ABG    Component Value Date/Time   PHART 7.376 10/04/2019 0420    PCO2ART 28.3 (L) 10/04/2019 0420   PO2ART 60.0 (L) 10/04/2019 0420   HCO3 16.2 (L) 10/04/2019 0420   ACIDBASEDEF 7.3 (H) 10/04/2019 0420   O2SAT 91.3 10/04/2019 0420     Coagulation Profile: Recent Labs  Lab 10/12/2019 2246  INR 1.2    Cardiac Enzymes: No results for input(s): CKTOTAL, CKMB, CKMBINDEX, TROPONINI in the last 168 hours.  HbA1C: No results found for: HGBA1C  CBG: No results for input(s): GLUCAP in the last 168 hours.   The patient is critically ill with multiple organ systems failure and requires high complexity decision making for assessment and  support, frequent evaluation and titration of therapies, application of advanced monitoring technologies and extensive interpretation of multiple databases. Critical Care Time devoted to patient care services described in this note is 72mnutes.     MChristinia Gully MD Pulmonary and [phone]  After 7:00 pm call Elink  [phone]

## 2019-10-04 NOTE — Progress Notes (Signed)
eLink Physician-Brief Progress Note Patient Name: Patricia Vang DOB: 1940-10-30 MRN: [id number]   Date of Service  10/04/2019  HPI/Events of Note  RN asking bed side team to assess once for hemoptysis vs throat oozing cough. + balance, on fluids. ECHO report pending. CxR stable. Earlier day Cr > 5. BUN elevated. cvp10.  eICU Interventions  - Lasix 40 mg IV once - asked to call [facility] team to assess her at bed side for now.  - RN going to call family/updating also.      Intervention Category Minor Interventions: Communication with other healthcare providers and/or family  Ranee Gosselin 10/04/2019, 4:02 AM

## 2019-10-04 NOTE — Progress Notes (Signed)
RT nasal suctioned patient and got thick tan and old blood secretions. Patient tolerated well.

## 2019-10-04 NOTE — Progress Notes (Signed)
eLink Physician-Brief Progress Note Patient Name: Patricia Vang DOB: 28-Jul-1940 MRN: [id number]   Date of Service  10/04/2019  HPI/Events of Note  Camera: for hypoxemia assessment. Has rhonchi. Suctioning making post throat bleeding/oozing?. CxR stable. DNR. No fever. MAP > 70. RR > 20. cough effort is good.   eICU Interventions  - Proventil neb stat - to go on HFNC - prn superficial suction. Has low platelet. - DNR.      Intervention Category Intermediate Interventions: Respiratory distress - evaluation and management Minor Interventions: Communication with other healthcare providers and/or family  Ranee Gosselin 10/04/2019, 3:27 AM

## 2019-10-04 NOTE — Progress Notes (Addendum)
Pharmacy Antibiotic Note  Patricia Vang is a 79 y.o. female admitted on 09/19/2019 with Sepsis.  Pharmacy has been consulted for meropenem dosing.  Plan: Meropenem 1gm iv q24hr  Weight: 67.6 kg (149 lb 0.5 oz)  Temp (24hrs), Avg:99.5 F (37.5 C), Min:98.5 F (36.9 C), Max:101 F (38.3 C)  Recent Labs  Lab 09/21/2019 2246 10/03/19 0218 10/04/19 0400  WBC 24.4* 18.5* 20.4*  CREATININE 5.47* 5.44*  --     CrCl cannot be calculated (Unknown ideal weight.).    Allergies  Allergen Reactions  . Penicillin G Hives  . Shellfish-Derived Products Anaphylaxis    Antimicrobials this admission: Meropenem 10/04/2019 >>  Dose adjustments this admission: -  Microbiology results: -  Thank you for allowing pharmacy to be a part of this patient's care.  Aleene Davidson Crowford 10/04/2019 4:49 AM

## 2019-10-04 NOTE — Progress Notes (Signed)
RT NT suctioned patient And got small amount of old bloody and tan secretions. Patient  did not tolerate well and was extremely agitated.

## 2019-10-05 DIAGNOSIS — J9601 Acute respiratory failure with hypoxia: Secondary | ICD-10-CM | POA: Diagnosis not present

## 2019-10-05 LAB — CBC
HCT: 32.3 % — ABNORMAL LOW (ref 36.0–46.0)
Hemoglobin: 11.1 g/dL — ABNORMAL LOW (ref 12.0–15.0)
MCH: 29 pg (ref 26.0–34.0)
MCHC: 34.4 g/dL (ref 30.0–36.0)
MCV: 84.3 fL (ref 80.0–100.0)
Platelets: 73 10*3/uL — ABNORMAL LOW (ref 150–400)
RBC: 3.83 MIL/uL — ABNORMAL LOW (ref 3.87–5.11)
RDW: 17.2 % — ABNORMAL HIGH (ref 11.5–15.5)
WBC: 17.8 10*3/uL — ABNORMAL HIGH (ref 4.0–10.5)
nRBC: 0 % (ref 0.0–0.2)

## 2019-10-05 LAB — BASIC METABOLIC PANEL
Anion gap: 14 (ref 5–15)
BUN: 66 mg/dL — ABNORMAL HIGH (ref 8–23)
CO2: 31 mmol/L (ref 22–32)
Calcium: 7.4 mg/dL — ABNORMAL LOW (ref 8.9–10.3)
Chloride: 100 mmol/L (ref 98–111)
Creatinine, Ser: 5.71 mg/dL — ABNORMAL HIGH (ref 0.44–1.00)
GFR calc Af Amer: 8 mL/min — ABNORMAL LOW (ref >60)
GFR calc non Af Amer: 7 mL/min — ABNORMAL LOW (ref >60)
Glucose, Bld: 155 mg/dL — ABNORMAL HIGH (ref 70–99)
Potassium: 3.3 mmol/L — ABNORMAL LOW (ref 3.5–5.1)
Sodium: 145 mmol/L (ref 135–145)

## 2019-10-05 LAB — MAGNESIUM: Magnesium: 2.5 mg/dL — ABNORMAL HIGH (ref 1.7–2.4)

## 2019-10-05 LAB — BLOOD GAS, ARTERIAL
Acid-Base Excess: 10.7 mmol/L — ABNORMAL HIGH (ref 0.0–2.0)
Bicarbonate: 34.6 mmol/L — ABNORMAL HIGH (ref 20.0–28.0)
O2 Saturation: 94.6 %
Patient temperature: 98.6
pCO2 arterial: 42.7 mmHg (ref 32.0–48.0)
pH, Arterial: 7.52 — ABNORMAL HIGH (ref 7.350–7.450)
pO2, Arterial: 66.5 mmHg — ABNORMAL LOW (ref 83.0–108.0)

## 2019-10-05 MED ORDER — NOREPINEPHRINE 4 MG/250ML-% IV SOLN
0.0000 ug/min | INTRAVENOUS | Status: DC
  Administered 2019-10-05: 9 ug/min via INTRAVENOUS
  Administered 2019-10-05: 2 ug/min via INTRAVENOUS
  Administered 2019-10-05: 9 ug/min via INTRAVENOUS
  Administered 2019-10-06: 7 ug/min via INTRAVENOUS
  Filled 2019-10-05 (×3): qty 250

## 2019-10-05 MED ORDER — MORPHINE SULFATE (PF) 2 MG/ML IV SOLN
0.5000 mg | Freq: Once | INTRAVENOUS | Status: AC
  Administered 2019-10-05: 0.5 mg via INTRAVENOUS

## 2019-10-05 MED ORDER — POTASSIUM CHLORIDE 10 MEQ/50ML IV SOLN
10.0000 meq | INTRAVENOUS | Status: AC
  Administered 2019-10-06 (×2): 10 meq via INTRAVENOUS
  Filled 2019-10-05 (×2): qty 50

## 2019-10-05 MED ORDER — POTASSIUM CHLORIDE 10 MEQ/50ML IV SOLN
10.0000 meq | INTRAVENOUS | Status: DC
Start: 2019-10-05 — End: 2019-10-05

## 2019-10-05 NOTE — Progress Notes (Signed)
NAME:  Patricia Vang, MRN:  [id number], DOB:  November 04, 1940, LOS: 3 ADMISSION DATE:  10-20-19, CONSULTATION DATE:  7/16 REFERRING MD:  Duke Salvia, CHIEF COMPLAINT:  Sepsis, hydronephrosis   Brief History   79 y/o F, non-verbal with hx dementia, CHF with recent UTI treated with bactrim x 2 days PTA who presented to [facility] 7/16 with worsening mental status, less mobile at home.  Hypotensive on arrival with SBP 90's, AKI with Scr 5.5, lactate 4.5.  CT abd revealed moderate L hydronephrosis extending down to a 1.0 cm proximal ureteral calculus and just proximal to this is a 0.6 cm proximal uteral calculus. UA opaque and brown with 1+ protein, 3+ blood, 3+ leuks. She was tx to [facility] for ICU care and IR/urology.    Fm made NCB  Pm 7/17 as per  her previously documented wishes.  Past Medical History   has a past medical history of Alzheimer's dementia (HCC), Cardiomyopathy (HCC), ESBL (extended spectrum beta-lactamase) producing bacteria infection, Failure to thrive in adult, Hypercalcemia, Hyperparathyroidism (HCC), Hypertensive CHF (HCC), Mild cognitive impairment, Nephrolithiasis, and Vitamin D deficiency.   Significant Hospital Events   7/16 Transferred from [facility] to [facility] 7/17  Left  PCN by IR  IR   Consults:  IR Urology  Procedures:  R IJ CVL ([place]) 7/16 >>  Significant Diagnostic Tests:  CT abd/pelvis Duke Salvia) >> CT abd revealed moderate L hydronephrosis extending down to a 1.0 cm proximal ureteral calculus and just proximal to this is a 0.6 cm proximal uteral calculus.   Micro Data:  Covid-19 PCR  7/16 >> neg  MRSA  PCR 7/16 >> neg  Urine 7/16 >> BC x 2 7/16 >> PCN Drain 7/17 >> proteus mirabilis >>  Antimicrobials:  Ceftriaxone 7/16 >> 7/18 Meropenem  7/18 >>  Interim history/subjective:  Afebrile  Remains on NRB Glucose range - 70-140 I/O UOP 1.7L, + 1.6L in last 24 hours  Objective   Blood pressure 134/63, pulse (!) 102, temperature 98.3  F (36.8 C), temperature source Axillary, resp. rate (!) 32, weight 67.6 kg, SpO2 94 %. CVP:  [3 mmHg-15 mmHg] 7 mmHg  FiO2 (%):  [100 %] 100 %   Intake/Output Summary (Last 24 hours) at 10/05/2019 0814 Last data filed at 10/05/2019 9622 Gross per 24 hour  Intake 3608.59 ml  Output 1780 ml  Net 1828.59 ml   Filed Weights   10/03/19 0500 10/04/19 0500 10/05/19 0500  Weight: 67.6 kg 67.6 kg 67.6 kg     Physical Exam: General: elderly female lying in bed, ill appearing HEENT: MM pink/dry, NRB in place, anicteric  Neuro: eyes closed, grimaces when touched, states "please", no follow commands, MAE  CV: s1s2 rrr, no m/r/g PULM: tachypnea, crackles / rhonchi bilaterally  GI: soft, bsx4 active  Extremities: warm/dry, no edema  Skin: no rashes or lesions  Resolved Hospital Problem list     Assessment & Plan:   Septic Shock secondary to Obstructive Uropathy, UTI / Pyelonephritis with Hydronephrosis   -continue IVF, see below  -abx as above  -tele monitoring  -follow CVP  Acute Hypoxemic Respiratory Failure with Diffuse Infiltrates  HCAP vs ALI from Sepsis  -continue O2 as needed for sats >90% -pulmonary hygiene as able - upright positioning -follow intermittent CXR  AKI  AG Metabolic Acidosis  S/P L PCN am 7/17  -Trend BMP / urinary output -Replace electrolytes as indicated -Avoid nephrotoxic agents, ensure adequate renal perfusion   Thrombocytpenia ? Baseline In setting of  sepsis  -trend CBC -monitor for bleeding  -transfuse for Hgb <7%  Hx Dementia  Nonverbal at baseline/ at risk TME  -supportive care -minimize sedating medications  -promote sleep / wake cycle  Best practice:  Diet: per SLP   Pain/Anxiety/Delirium protocol (if indicated): n/a VAP protocol (if indicated): n/a DVT prophylaxis: PAS  GI prophylaxis: n/a Glucose control: n/a Mobility: BR Code Status: Full code Family Communication: Jethro Poling, patient's son and HCPOA [phone]) updated via  phone 7/19 Disposition: ICU  Labs   CBC: Recent Labs  Lab 10/19/2019 2246 10/03/19 0218 10/04/19 0400  WBC 24.4* 18.5* 20.4*  HGB 10.6* 10.3* 11.4*  HCT 31.6* 31.9* 34.0*  MCV 89.0 89.9 87.6  PLT 79* 76* 77*    Basic Metabolic Panel: Recent Labs  Lab 19-Oct-2019 2246 10/03/19 0218 10/04/19 0400  NA 135 135 139  K 4.4 4.3 4.3  CL 107 107 109  CO2 18* 17* <7*  GLUCOSE 82 77 140*  BUN 59* 61* 64*  CREATININE 5.47* 5.44* 6.02*  CALCIUM 7.5* 7.5* 8.3*   GFR: CrCl cannot be calculated (Unknown ideal weight.). Recent Labs  Lab 19-Oct-2019 2246 10/03/19 0218 10/04/19 0400  WBC 24.4* 18.5* 20.4*    Liver Function Tests: Recent Labs  Lab 10/19/19 2246 10/03/19 0218  AST 68* 65*  ALT 38 35  ALKPHOS 121 112  BILITOT 0.7 0.5  PROT 5.5* 5.2*  ALBUMIN 2.4* 2.2*   No results for input(s): LIPASE, AMYLASE in the last 168 hours. No results for input(s): AMMONIA in the last 168 hours.  ABG    Component Value Date/Time   PHART 7.376 10/04/2019 0420   PCO2ART 28.3 (L) 10/04/2019 0420   PO2ART 60.0 (L) 10/04/2019 0420   HCO3 16.2 (L) 10/04/2019 0420   ACIDBASEDEF 7.3 (H) 10/04/2019 0420   O2SAT 91.3 10/04/2019 0420     Coagulation Profile: Recent Labs  Lab October 19, 2019 2246  INR 1.2    Cardiac Enzymes: No results for input(s): CKTOTAL, CKMB, CKMBINDEX, TROPONINI in the last 168 hours.  HbA1C: No results found for: HGBA1C  CBG: No results for input(s): GLUCAP in the last 168 hours.   CC Time: 30 minutes   Canary Brim, MSN, NP-C [facility] 10/05/2019, 8:24 AM   Please see [email or web address] for pager details.

## 2019-10-05 NOTE — Progress Notes (Signed)
eLink Physician-Brief Progress Note Patient Name: Patricia Vang DOB: 12/14/1940 MRN: [id number]   Date of Service  10/05/2019  HPI/Events of Note  CVP = 17.   eICU Interventions  Continue present management.      Intervention Category Major Interventions: Hypotension - evaluation and management  Lenell Antu 10/05/2019, 8:41 PM

## 2019-10-05 NOTE — Progress Notes (Signed)
eLink Physician-Brief Progress Note Patient Name: Aayliah Rotenberry DOB: Jan 23, 1941 MRN: [id number]   Date of Service  10/05/2019  HPI/Events of Note  Frequent ectopy - K+ = 3.3 and Creatinine = 5.71.   eICU Interventions  Will cautiously replace K+.      Intervention Category Major Interventions: Electrolyte abnormality - evaluation and management  Marcella Charlson Eugene 10/05/2019, 11:14 PM

## 2019-10-05 NOTE — Progress Notes (Signed)
Notified CCM MD in regards to continued tachypnea and frequency of cardiac ectopy. No further orders at this time

## 2019-10-05 NOTE — Progress Notes (Signed)
Discussed with CCM NP in regards to pt increased respiratory rate and distress. Pt maintaining O2 saturations at 94% on nonrebreather. Also discussed increased HR  frequency of PVCs

## 2019-10-05 NOTE — Progress Notes (Signed)
SLP Cancellation Note  Patient Details Name: Patricia Vang MRN: [id number] DOB: 1941-02-12   Cancelled treatment:       Reason Eval/Treat Not Completed: Medical issues which prohibited therapy. Per RN, pt continues to be inappropriate for ST intervention due to medical status. Will sign off at this time. Please reconsult if needs arise.  Rosina Cressler B. Murvin Natal, [facility], CCC-SLP Speech Language Pathologist Office: [phone] Pager: [phone]  Leigh Aurora 10/05/2019, 11:34 AM

## 2019-10-05 NOTE — Progress Notes (Signed)
eLink Physician-Brief Progress Note Patient Name: Patricia Vang DOB: 09/07/1940 MRN: [id number]   Date of Service  10/05/2019  HPI/Events of Note  Last pH = 7.376/28.3/60.0/16.2. Patient on Norepinephrine and NaHCO3 IV infusion.   eICU Interventions  Plan: 1. ABG, BMP, CBC and Mg++ level now.  2. Monitor CVP now and Q 4 hours.      Intervention Category Major Interventions: Acid-Base disturbance - evaluation and management  Qasim Diveley Eugene 10/05/2019, 8:10 PM

## 2019-10-05 NOTE — Progress Notes (Signed)
Referring Physician(s): Dr Jerilee Hoh  Supervising Physician: Dr. Deanne Coffer  Patient Status:  [facility] - In-pt  Chief Complaint:  Urosepsis Left sided obstructing stone  Subjective:  (L) PCN placed in IR 7/16- Dr Loreta Ave  Pt in ICU Resting Demented Seems comfortable   Allergies: Penicillin g and Shellfish-derived products  Medications:  Current Facility-Administered Medications:  .  0.9 %  sodium chloride infusion, 250 mL, Intravenous, Continuous, Nyoka Cowden, MD, Last Rate: 10 mL/hr at 10/05/19 0808, Rate Verify at 10/05/19 0808 .  acetaminophen (TYLENOL) suppository 325 mg, 325 mg, Rectal, Q8H PRN, Mohan, Kinila T, MD .  albuterol (PROVENTIL) (2.5 MG/3ML) 0.083% nebulizer solution 2.5 mg, 2.5 mg, Nebulization, Q4H PRN, Nyoka Cowden, MD, 2.5 mg at 10/05/19 0818 .  Chlorhexidine Gluconate Cloth 2 % PADS 6 each, 6 each, Topical, Q0600, Kalman Shan, MD, 6 each at 10/05/19 0629 .  docusate sodium (COLACE) capsule 100 mg, 100 mg, Oral, BID PRN, Luciano Cutter, MD .  MEDLINE mouth rinse, 15 mL, Mouth Rinse, BID, Nyoka Cowden, MD, 15 mL at 10/05/19 0821 .  meropenem (MERREM) 1 g in sodium chloride 0.9 % 100 mL IVPB, 1 g, Intravenous, Q24H, Luciano Cutter, MD, Stopped at 10/05/19 0700 .  morphine 2 MG/ML injection 0.5 mg, 0.5 mg, Intravenous, Q4H PRN, Jeannette Corpus T, MD, 0.5 mg at 10/04/19 2338 .  norepinephrine (LEVOPHED) 4mg  in premix infusion, 0-40 mcg/min, Intravenous, Titrated, [place], , MD, Stopped at 10/05/19 0726 .  polyethylene glycol (MIRALAX / GLYCOLAX) packet 17 g, 17 g, Oral, Daily PRN, 10/14/2019, MD .  sodium bicarbonate 150 mEq in dextrose 5% 1000 mL infusion, 150 mEq, Intravenous, Continuous, Luciano Cutter, MD, Last Rate: 100 mL/hr at 10/05/19 0822, 150 mEq at 10/05/19 0822 .  sodium chloride flush (NS) 0.9 % injection 10-40 mL, 10-40 mL, Intracatheter, Q12H, 09-08-1996, MD, 10 mL at 10/05/19 0821 .  sodium chloride  flush (NS) 0.9 % injection 10-40 mL, 10-40 mL, Intracatheter, PRN, 09-08-1996, MD    Vital Signs: BP 134/63   Pulse (!) 102   Temp 98.6 F (37 C) (Axillary)   Resp (!) 32   Wt 67.6 kg   SpO2 94%   BMI 26.40 kg/m   Physical Exam Skin:    General: Skin is warm.     Comments: Site of (L) PCN is clean and dry NT no bleeding OP thin blood tinged, no clots      Imaging: DG CHEST PORT 1 VIEW  Result Date: 10/04/2019 CLINICAL DATA:  Initial evaluation for respiratory distress. EXAM: PORTABLE CHEST 1 VIEW COMPARISON:  Prior radiograph from 10/03/2019. FINDINGS: Cardiomegaly, stable. Mediastinal silhouette grossly unchanged. Right IJ approach central venous catheter remains in place with tip overlying the cavoatrial junction, stable. Extensive bilateral pulmonary opacities are seen, right worse than left, overall little interval change from previous. No visible pneumothorax. Suspected small bilateral pleural effusions. Osseous structures are unchanged. IMPRESSION: 1. No significant interval change in extensive right greater than left pulmonary opacities, concerning for multifocal pneumonia. A degree of underlying pulmonary interstitial edema is suspected. 2. Probable small bilateral pleural effusions. Electronically Signed   By: 10/05/2019 M.D.   On: 10/04/2019 05:57   DG Chest Port 1 View  Result Date: 10/03/2019 CLINICAL DATA:  79 year old female with displacement of central venous catheter. EXAM: PORTABLE CHEST 1 VIEW COMPARISON:  Chest radiograph dated 10/03/2019. FINDINGS: Right IJ central venous line in similar position  with tip over central SVC close to the cavoatrial junction. Interval worsening of bilateral pulmonary opacities compared to the earlier radiograph which may be partly related to shallow inspiration. No pneumothorax. Stable cardiomediastinal silhouette. No acute osseous pathology. IMPRESSION: Right IJ central venous line with tip over central SVC. No  pneumothorax. Electronically Signed   By: Elgie Collard M.D.   On: 10/03/2019 21:13   DG Chest Port 1 View  Result Date: 10/03/2019 CLINICAL DATA:  Sepsis. EXAM: PORTABLE CHEST 1 VIEW COMPARISON:  October 02, 2019. FINDINGS: Stable cardiomediastinal silhouette. No pneumothorax is noted. New right upper and bilateral basilar opacities are noted concerning for multifocal pneumonia. Small pleural effusions may be present. Bony thorax is unremarkable. IMPRESSION: New right upper and bilateral basilar opacities are noted concerning for multifocal pneumonia. Small pleural effusions may be present. Electronically Signed   By: Lupita Raider M.D.   On: 10/03/2019 09:15   IR NEPHROSTOMY PLACEMENT LEFT  Result Date: 10/03/2019 INDICATION: 79 year old female with obstructing left-sided ureteral stone and urosepsis EXAM: IMAGE GUIDED PLACEMENT OF LEFT-SIDED PERCUTANEOUS NEPHROSTOMY COMPARISON:  None. MEDICATIONS: Inpatient antibiotics ANESTHESIA/SEDATION: Fentanyl 50 mcg IV; Versed 1.0 mg IV Moderate Sedation Time:  10 minutes The patient was continuously monitored during the procedure by the interventional radiology nurse under my direct supervision. CONTRAST:  10 cc-administered into the collecting system(s) FLUOROSCOPY TIME:  Fluoroscopy Time: 0 minutes 30 seconds COMPLICATIONS: None PROCEDURE: Informed written consent was obtained from the patient's family after a thorough discussion of the procedural risks, benefits and alternatives. All questions were addressed. Maximal Sterile Barrier Technique was utilized including caps, mask, sterile gowns, sterile gloves, sterile drape, hand hygiene and skin antiseptic. A timeout was performed prior to the initiation of the procedure. Patient positioned prone position on the fluoroscopy table. Ultrasound survey of the left flank was performed with images stored and sent to PACs. The patient was then prepped and draped in the usual sterile fashion. 1% lidocaine was used to  anesthetize the skin and subcutaneous tissues for local anesthesia. A Chiba needle was then used to access a posterior inferior calyx with ultrasound guidance. With spontaneous urine returned through the needle, passage of an 018 micro wire into the collecting system was performed under fluoroscopy. A small incision was made with an 11 blade scalpel, and the needle was removed from the wire. An Accustick system was then advanced over the wire into the collecting system under fluoroscopy. The metal stiffener and inner dilator were removed, and then a sample of fluid was aspirated through the 4 French outer sheath. Bentson wire was passed into the collecting system and the sheath removed. Ten French dilation of the soft tissues was performed. Using modified Seldinger technique, a 10 French pigtail catheter drain was placed over the Bentson wire. Wire and inner stiffener removed, and the pigtail was formed in the collecting system. Small amount of contrast confirmed position of the catheter. Patient tolerated the procedure well and remained hemodynamically stable throughout. No complications were encountered and no significant blood loss encountered IMPRESSION: Status post left-sided percutaneous nephrostomy for obstructing left ureteral stone. Signed, Yvone Neu. Reyne Dumas, RPVI [facility] Electronically Signed   By: Gilmer Mor D.O.   On: 10/03/2019 01:46    Labs:  CBC: Recent Labs    10/09/2019 2246 10/03/19 0218 10/04/19 0400  WBC 24.4* 18.5* 20.4*  HGB 10.6* 10.3* 11.4*  HCT 31.6* 31.9* 34.0*  PLT 79* 76* 77*    COAGS: Recent Labs  10-06-19 2246  INR 1.2    BMP: Recent Labs    10-06-2019 2246 10/03/19 0218 10/04/19 0400  NA 135 135 139  K 4.4 4.3 4.3  CL 107 107 109  CO2 18* 17* <7*  GLUCOSE 82 77 140*  BUN 59* 61* 64*  CALCIUM 7.5* 7.5* 8.3*  CREATININE 5.47* 5.44* 6.02*  GFRNONAA 7* 7* 6*  GFRAA 8* 8* 7*    LIVER  FUNCTION TESTS: Recent Labs    2019/10/06 2246 10/03/19 0218  BILITOT 0.7 0.5  AST 68* 65*  ALT 38 35  ALKPHOS 121 112  PROT 5.5* 5.2*  ALBUMIN 2.4* 2.2*    Assessment and Plan: (L) PCN intact, good output Will follow Plan per PCCM  Electronically Signed: Brayton El, PA-C 10/05/2019, 9:54 AM   I spent a total of 15 Minutes at the the patient's bedside AND on the patient's hospital floor or unit, greater than 50% of which was counseling/coordinating care for left PCN

## 2019-10-06 LAB — CBC
HCT: 29.2 % — ABNORMAL LOW (ref 36.0–46.0)
Hemoglobin: 9.9 g/dL — ABNORMAL LOW (ref 12.0–15.0)
MCH: 28.9 pg (ref 26.0–34.0)
MCHC: 33.9 g/dL (ref 30.0–36.0)
MCV: 85.1 fL (ref 80.0–100.0)
Platelets: 66 10*3/uL — ABNORMAL LOW (ref 150–400)
RBC: 3.43 MIL/uL — ABNORMAL LOW (ref 3.87–5.11)
RDW: 17.2 % — ABNORMAL HIGH (ref 11.5–15.5)
WBC: 16.9 10*3/uL — ABNORMAL HIGH (ref 4.0–10.5)
nRBC: 0 % (ref 0.0–0.2)

## 2019-10-06 LAB — BASIC METABOLIC PANEL
Anion gap: 14 (ref 5–15)
BUN: 67 mg/dL — ABNORMAL HIGH (ref 8–23)
CO2: 34 mmol/L — ABNORMAL HIGH (ref 22–32)
Calcium: 7.4 mg/dL — ABNORMAL LOW (ref 8.9–10.3)
Chloride: 100 mmol/L (ref 98–111)
Creatinine, Ser: 5.47 mg/dL — ABNORMAL HIGH (ref 0.44–1.00)
GFR calc Af Amer: 8 mL/min — ABNORMAL LOW (ref >60)
GFR calc non Af Amer: 7 mL/min — ABNORMAL LOW (ref >60)
Glucose, Bld: 135 mg/dL — ABNORMAL HIGH (ref 70–99)
Potassium: 3.5 mmol/L (ref 3.5–5.1)
Sodium: 148 mmol/L — ABNORMAL HIGH (ref 135–145)

## 2019-10-06 LAB — CORTISOL: Cortisol, Plasma: 47.4 ug/dL

## 2019-10-06 MED ORDER — ACETAMINOPHEN 325 MG PO TABS: 650.0000 mg | ORAL_TABLET | Freq: Four times a day (QID) | ORAL | Status: DC | PRN

## 2019-10-06 MED ORDER — POLYVINYL ALCOHOL 1.4 % OP SOLN
1.0000 [drp] | Freq: Four times a day (QID) | OPHTHALMIC | Status: DC | PRN
  Filled 2019-10-06: qty 15

## 2019-10-06 MED ORDER — DEXTROSE IN LACTATED RINGERS 5 % IV SOLN: INTRAVENOUS | Status: DC

## 2019-10-06 MED ORDER — LORAZEPAM 2 MG/ML IJ SOLN
2.0000 mg | INTRAMUSCULAR | Status: DC | PRN
  Administered 2019-10-06: 2 mg via INTRAVENOUS

## 2019-10-06 MED ORDER — FENTANYL CITRATE (PF) 100 MCG/2ML IJ SOLN
12.5000 ug | INTRAMUSCULAR | Status: DC | PRN
  Administered 2019-10-06: 12.5 ug via INTRAVENOUS
  Filled 2019-10-06: qty 2

## 2019-10-06 MED ORDER — ACETAMINOPHEN 650 MG RE SUPP
650.0000 mg | Freq: Four times a day (QID) | RECTAL | Status: DC | PRN
Start: 2019-10-06 — End: 2019-10-06

## 2019-10-06 MED ORDER — SODIUM BICARBONATE 8.4 % IV SOLN
INTRAVENOUS | Status: DC
  Filled 2019-10-06: qty 150

## 2019-10-06 MED ORDER — DIPHENHYDRAMINE HCL 50 MG/ML IJ SOLN: 25.0000 mg | INTRAMUSCULAR | Status: DC | PRN

## 2019-10-06 MED ORDER — MORPHINE SULFATE (PF) 2 MG/ML IV SOLN
INTRAVENOUS | Status: AC
  Filled 2019-10-06: qty 1

## 2019-10-06 MED ORDER — MORPHINE SULFATE (PF) 2 MG/ML IV SOLN
1.0000 mg | INTRAVENOUS | Status: DC | PRN
  Administered 2019-10-06 (×2): 2 mg via INTRAVENOUS
  Administered 2019-10-06 (×2): 1 mg via INTRAVENOUS
  Filled 2019-10-06 (×3): qty 1

## 2019-10-06 MED ORDER — LORAZEPAM 2 MG/ML IJ SOLN
INTRAMUSCULAR | Status: AC
  Filled 2019-10-06: qty 1

## 2019-10-06 MED ORDER — MORPHINE 100MG IN NS 100ML (1MG/ML) PREMIX INFUSION
1.0000 mg/h | INTRAVENOUS | Status: DC
  Administered 2019-10-06: 1 mg/h via INTRAVENOUS
  Filled 2019-10-06: qty 100

## 2019-10-06 MED ORDER — GLYCOPYRROLATE 1 MG PO TABS: 1.0000 mg | ORAL_TABLET | ORAL | Status: DC | PRN

## 2019-10-06 MED ORDER — GLYCOPYRROLATE 0.2 MG/ML IJ SOLN: 0.2000 mg | INTRAMUSCULAR | Status: DC | PRN

## 2019-10-06 MED ORDER — DEXTROSE 5 % IV SOLN: INTRAVENOUS | Status: DC

## 2019-10-06 NOTE — TOC Initial Note (Signed)
Transition of Care [facility]) - Initial/Assessment Note    Patient Details  Name: Patricia Vang MRN: [id number] Date of Birth: [birth year]  [facility] of [facility]) CM/SW Contact:    Golda Acre, RN Phone Number: 10/06/2019, 8:20 AM  Clinical Narrative:                 79 yo female with spetic shock on ib avx and iv levophed. From home and has pcp in [place].  Here with family. Plan to return to home.  Expected Discharge Plan: Home/Self Care Barriers to Discharge: Continued Medical Work up   Patient Goals and CMS Choice Patient states their goals for this hospitalization and ongoing recovery are:: to go home CMS Medicare.gov Compare Post Acute Care list provided to:: Patient    Expected Discharge Plan and Services Expected Discharge Plan: Home/Self Care   Discharge Planning Services: CM Consult   Living arrangements for the past 2 months: Single Family Home                                      Prior Living Arrangements/Services Living arrangements for the past 2 months: Single Family Home Lives with:: Spouse, Adult Children Patient language and need for interpreter reviewed:: Yes Do you feel safe going back to the place where you live?: Yes      Need for Family Participation in Patient Care: Yes (Comment) Care giver support system in place?: Yes (comment)   Criminal Activity/Legal Involvement Pertinent to Current Situation/Hospitalization: No - Comment as needed  Activities of Daily Living      Permission Sought/Granted                  Emotional Assessment Appearance:: Appears stated age     Orientation: : Oriented to Self, Oriented to Place, Oriented to  Time, Oriented to Situation Alcohol / Substance Use: Not Applicable Psych Involvement: No (comment)  Admission diagnosis:  Septic shock (HCC) [A41.9, R65.21] Patient Active Problem List   Diagnosis Date Noted  . Metabolic acidosis, increased anion gap 10/04/2019  .  Acute hypoxemic respiratory failure (HCC) 10/04/2019  . Acute renal failure (HCC) 10/03/2019  . Thrombocytopenia (HCC) 10/03/2019  . Pressure injury of skin 10/03/2019  . Septic shock (HCC) 09/17/2019  . Primary hyperparathyroidism (HCC) 09/25/2016  . Hyperparathyroidism (HCC) 07/30/2016  . Alzheimer's dementia without behavioral disturbance (HCC) 07/25/2016  . Hypercalcemia 07/25/2016  . Failure to thrive in adult 05/23/2016  . Nephrolithiasis 04/15/2016  . Cardiomyopathy (HCC) 09/21/2015  . Shortness of breath 08/31/2015  . Hypertensive heart disease with CHF (congestive heart failure) (HCC) 08/02/2015   PCP:  Everlean Cherry, MD Pharmacy:   [facility] [address], [place] - [address] [facility] [place] [address] Phone: [phone] Fax: [phone]  [facility] - [place], [place] - [address] [place] [address] Phone: [phone] Fax: [phone]     Social Determinants of Health (SDOH) Interventions    Readmission Risk Interventions No flowsheet data found.

## 2019-10-06 NOTE — Progress Notes (Signed)
Pt NT suctioned for a scant amt of blood tinged secretions.  Pt tolerated fairly well, RT to monitor and assess as needed.

## 2019-10-06 NOTE — Progress Notes (Signed)
eLink Physician-Brief Progress Note Patient Name: Patricia Vang DOB: [birth year] MRN: [id number]   Date of Service  10/06/2019  HPI/Events of Note  Patient currently comfort measures - family request for Morphine IV infusion.   eICU Interventions  Plan: 1. Morphine IV infusion. Titrate to patient comfort.      Intervention Category Major Interventions: Other:  Lenell Antu 10/06/2019, 8:15 PM

## 2019-10-06 NOTE — Progress Notes (Signed)
Urology Inpatient Progress Report  Septic shock (HCC) [A41.9, R65.21]        Intv/Subj: Patient with non-rebreather and minimally responsive (non-verbal at baseline).   Active Problems:   Alzheimer's dementia without behavioral disturbance (HCC)   Septic shock (HCC)   Acute renal failure (HCC)   Thrombocytopenia (HCC)   Pressure injury of skin   Metabolic acidosis, increased anion gap   Acute hypoxemic respiratory failure (HCC)  Current Facility-Administered Medications  Medication Dose Route Frequency Provider Last Rate Last Admin  . 0.9 %  sodium chloride infusion  250 mL Intravenous Continuous Nyoka Cowden, MD   Stopped at 10/06/19 0121  . acetaminophen (TYLENOL) suppository 325 mg  325 mg Rectal Q8H PRN Josiah Lobo T, MD      . albuterol (PROVENTIL) (2.5 MG/3ML) 0.083% nebulizer solution 2.5 mg  2.5 mg Nebulization Q4H PRN Nyoka Cowden, MD   2.5 mg at 10/05/19 0818  . Chlorhexidine Gluconate Cloth 2 % PADS 6 each  6 each Topical Q0600 Kalman Shan, MD   6 each at 10/06/19 [phone]  . docusate sodium (COLACE) capsule 100 mg  100 mg Oral BID PRN Luciano Cutter, MD      . MEDLINE mouth rinse  15 mL Mouth Rinse BID Nyoka Cowden, MD   15 mL at 10/05/19 2245  . meropenem (MERREM) 1 g in sodium chloride 0.9 % 100 mL IVPB  1 g Intravenous Q24H Luciano Cutter, MD   Stopped at 10/06/19 [phone]  . morphine 2 MG/ML injection 0.5 mg  0.5 mg Intravenous Q4H PRN Jeannette Corpus T, MD   0.5 mg at 10/05/19 2245  . norepinephrine (LEVOPHED) 4mg  in premix infusion  0-40 mcg/min Intravenous Titrated T, MD 26.3 mL/hr at 10/06/19 0700 7 mcg/min at 10/06/19 0700  . polyethylene glycol (MIRALAX / GLYCOLAX) packet 17 g  17 g Oral Daily PRN 10/08/19, MD      . sodium chloride flush (NS) 0.9 % injection 10-40 mL  10-40 mL Intracatheter Q12H 09-08-1996, MD   10 mL at 10/05/19 2245  . sodium chloride flush (NS) 0.9 % injection 10-40 mL  10-40 mL  Intracatheter PRN 09-08-1996, MD         Objective: Vital: Vitals:   10/06/19 0530 10/06/19 0600 10/06/19 0630 10/06/19 0700  BP: (!) 118/41 (!) 162/133 (!) 138/91 (!) 151/70  Pulse: (!) 47 (!) 101 98 96  Resp: (!) 28 (!) 25 (!) 25 (!) 24  Temp:    99.4 F (37.4 C)  TempSrc:    Axillary  SpO2: 95% 97% 91% (!) 87%  Weight:       I/Os: I/O last 3 completed shifts: In: 3584.2 [I.V.:3408.3; IV Piggyback:175.9] Out: 1400 [Urine:1400]  Physical Exam:  General: occasional moan Lungs: tachypnic, non-rebreather L PCN tube draining blood tinged/tea colored urine. Foley: draining tea colored with urine with clot   Lab Results: Recent Labs    10/04/19 0400 10/05/19 2002 10/06/19 0500  WBC 20.4* 17.8* 16.9*  HGB 11.4* 11.1* 9.9*  HCT 34.0* 32.3* 29.2*   Recent Labs    10/04/19 0400 10/05/19 2002 10/06/19 0500  NA 139 145 148*  K 4.3 3.3* 3.5  CL 109 100 100  CO2 <7* 31 34*  GLUCOSE 140* 155* 135*  BUN 64* 66* 67*  CREATININE 6.02* 5.71* 5.47*  CALCIUM 8.3* 7.4* 7.4*   No results for input(s): LABPT, INR in the last 72 hours. No results  for input(s): LABURIN in the last 72 hours. Results for orders placed or performed during the hospital encounter of 09/28/2019  MRSA PCR Screening     Status: None   Collection Time: 09/29/2019  9:54 PM   Specimen: Nasal Mucosa; Nasopharyngeal  Result Value Ref Range Status   MRSA by PCR NEGATIVE NEGATIVE Final    Comment:        The GeneXpert MRSA Assay (FDA approved for NASAL specimens only), is one component of a comprehensive MRSA colonization surveillance program. It is not intended to diagnose MRSA infection nor to guide or monitor treatment for MRSA infections. Performed at [facility], [address] [place], [place] [address]   SARS Coronavirus 2 by RT PCR (hospital order, performed in [facility] hospital lab) Nasopharyngeal Nasopharyngeal Swab     Status: None   Collection Time: 09/24/2019  10:43 PM   Specimen: Nasopharyngeal Swab  Result Value Ref Range Status   SARS Coronavirus 2 NEGATIVE NEGATIVE Final    Comment: (NOTE) SARS-CoV-2 target nucleic acids are NOT DETECTED.  The SARS-CoV-2 RNA is generally detectable in upper and lower respiratory specimens during the acute phase of infection. The lowest concentration of SARS-CoV-2 viral copies this assay can detect is 250 copies / mL. A negative result does not preclude SARS-CoV-2 infection and should not be used as the sole basis for treatment or other patient management decisions.  A negative result may occur with improper specimen collection / handling, submission of specimen other than nasopharyngeal swab, presence of viral mutation(s) within the areas targeted by this assay, and inadequate number of viral copies (<250 copies / mL). A negative result must be combined with clinical observations, patient history, and epidemiological information.  Fact Sheet for Patients:   BoilerBrush.com.cy  Fact Sheet for Healthcare Providers: https://pope.com/  This test is not yet approved or  cleared by the [place] FDA and has been authorized for detection and/or diagnosis of SARS-CoV-2 by FDA under an Emergency Use Authorization (EUA).  This EUA will remain in effect (meaning this test can be used) for the duration of the COVID-19 declaration under Section 564(b)(1) of the Act, 21 U.S.C. section 360bbb-3(b)(1), unless the authorization is terminated or revoked sooner.  Performed at [facility], [address], [place], [place] [address]   Culture, blood (routine x 2)     Status: None (Preliminary result)   Collection Time: 09/26/2019 10:47 PM   Specimen: BLOOD LEFT HAND  Result Value Ref Range Status   Specimen Description   Final    BLOOD LEFT HAND Performed at [facility], [address] [place], [place] [address]    Special  Requests   Final    BOTTLES DRAWN AEROBIC ONLY Blood Culture adequate volume Performed at [facility], [address], [place], [place] [address]    Culture   Final    NO GROWTH 3 DAYS Performed at [facility], [address], [place], [place] [address]    Report Status PENDING  Incomplete  Culture, blood (routine x 2)     Status: None (Preliminary result)   Collection Time: 10/06/2019 10:52 PM   Specimen: BLOOD  Result Value Ref Range Status   Specimen Description   Final    BLOOD RIGHT ANTECUBITAL Performed at [facility], [address] [place], [place] [address]    Special Requests   Final    BOTTLES DRAWN AEROBIC ONLY Blood Culture adequate volume  Performed at [facility], [address], [place], [place] [address]    Culture   Final    NO GROWTH 3 DAYS Performed at [facility], [address], [place], [place] [address]    Report Status PENDING  Incomplete  Aerobic/Anaerobic Culture (surgical/deep wound)     Status: None (Preliminary result)   Collection Time: 10/03/19  1:40 AM   Specimen: Kidney; Urine  Result Value Ref Range Status   Specimen Description KIDNEY  Final   Special Requests   Final    IMAGE GUIDED DRAIN PLACEMENT, LEFT PCN URINE FROM LEFT KIDNEY   Gram Stain   Final    ABUNDANT WBC PRESENT, PREDOMINANTLY PMN FEW GRAM NEGATIVE RODS    Culture   Final    ABUNDANT PROTEUS MIRABILIS REPEATING SUSCEPTIBILITY HOLDING FOR POSSIBLE ANAEROBE Performed at [facility], [address] [place], [place] [address]    Report Status PENDING  Incomplete    Studies/Results: No results found.  Assessment: 79yo with a hx of dementia, CHF who was transferred from [facility] with sepsis from multiple left ureteral calculi. She underwent successful left nephrostomy tube placement by IR and is  currently in the ICU.  WBC and Cr improving however patient still with significant  hypotension on pressors and hypoxemia.    Plan: -continue L PCN tube and foley for maximal drainage -culture directed antibiotics -will need definitive stone removal after patient has stabilized (at least 3 weeks from now)   Kasandra Knudsen, MD Urology 10/06/2019, 8:38 AM

## 2019-10-06 NOTE — Progress Notes (Addendum)
NAME:  Patricia Vang, MRN:  [id number], DOB:  08-20-1940, LOS: 4 ADMISSION DATE:  09/28/2019, CONSULTATION DATE:  7/16 REFERRING MD:  Duke Salvia, CHIEF COMPLAINT:  Sepsis, hydronephrosis   Brief History   79 y/o F, non-verbal with hx dementia, CHF with recent UTI treated with bactrim x 2 days PTA who presented to [facility] 7/16 with worsening mental status, less mobile at home.  Hypotensive on arrival with SBP 90's, AKI with Scr 5.5, lactate 4.5.  CT abd revealed moderate L hydronephrosis extending down to a 1.0 cm proximal ureteral calculus and just proximal to this is a 0.6 cm proximal uteral calculus. UA opaque and brown with 1+ protein, 3+ blood, 3+ leuks. She was tx to [facility] for ICU care and IR/urology.    Family made NCB  Pm 7/17 as per  her previously documented wishes.  Past Medical History   has a past medical history of Alzheimer's dementia (HCC), Cardiomyopathy (HCC), ESBL (extended spectrum beta-lactamase) producing bacteria infection, Failure to thrive in adult, Hypercalcemia, Hyperparathyroidism (HCC), Hypertensive CHF (HCC), Mild cognitive impairment, Nephrolithiasis, and Vitamin D deficiency.   Significant Hospital Events   7/16 Transferred from [facility] to [facility] 7/17  Left PCN by IR  Consults:  IR Urology  Procedures:  R IJ CVL Duke Salvia) 7/16 >>  Significant Diagnostic Tests:  CT abd/pelvis Duke Salvia) >> CT abd revealed moderate L hydronephrosis extending down to a 1.0 cm proximal ureteral calculus and just proximal to this is a 0.6 cm proximal uteral calculus.   Micro Data:  Covid-19 PCR  7/16 >> neg  MRSA  PCR 7/16 >> neg  Urine 7/16 >> BC x 2 7/16 >> PCN Drain 7/17 >> proteus mirabilis >>  Antimicrobials:  Ceftriaxone 7/16 >> 7/18 Meropenem  7/18 >>  Interim history/subjective:  Tmax 99.7 / WBC 16.9 RN reports pt appears uncomfortable, low dose morphine drops BP On 7 mcg's levophed  I/O 495 ml UOP, +3L in last 24 hours  Objective     Blood pressure (!) 151/70, pulse 96, temperature 99.4 F (37.4 C), temperature source Axillary, resp. rate (!) 24, weight 67.6 kg, SpO2 (!) 87 %. CVP:  [6 mmHg-17 mmHg] 6 mmHg  FiO2 (%):  [100 %] 100 %   Intake/Output Summary (Last 24 hours) at 10/06/2019 1157 Last data filed at 10/06/2019 0700 Gross per 24 hour  Intake 3424.55 ml  Output 495 ml  Net 2929.55 ml   Filed Weights   10/04/19 0500 10/05/19 0500 10/06/19 0500  Weight: 67.6 kg 67.6 kg 67.6 kg     Physical Exam: General: elderly female lying in bed, appears uncomfortable  HEENT: MM pink / dry, salter  O2 + NRB mask Neuro: eyes closed, no follow commands, moves all extremities spontaneously CV: s1s2 rrr, no m/r/g PULM: tachypnea, diminished breath sounds, crackles  GI: soft, bsx4 active  Extremities: warm/dry, generalized trace to 1+ edema  Skin: no rashes or lesions  Resolved Hospital Problem list     Assessment & Plan:   Septic Shock secondary to Obstructive Uropathy, UTI / Pyelonephritis with Hydronephrosis   -stop bicarb gtt -abx as above -levophed for MAP >65  -follow CVP  -appreciate Urology input > no plan for stone removal at this time due to critical illness, will plan for outpatient   Acute Hypoxemic Respiratory Failure with Diffuse Infiltrates  HCAP vs ALI from Sepsis  -O2 as needed to support sats >90% -follow intermittent CXR  -pulmonary hygiene as able - upright positioning  -aspiration  precautions   AKI  AG Metabolic Acidosis  S/P L PCN am 7/17  -appreciate Urology  -stop bicarbonate gtt -change IVF to D5LR at 40 ml/hr -follow BMP / UOP -monitor electrolytes and replace as indicated    Thrombocytpenia ? Baseline Leukocytosis  In setting of sepsis  -follow CBC  -transfuse for Hgb <7% or active bleeding   Hx Dementia  Nonverbal at baseline/ at risk TME  Pain -supportive care  -change morphine to low dose fentanyl  -delirium prevention measures - promote sleep/wake cycle  Best  practice:  Diet: per SLP   Pain/Anxiety/Delirium protocol (if indicated): n/a VAP protocol (if indicated): n/a DVT prophylaxis: PAS  GI prophylaxis: n/a Glucose control: n/a Mobility: BR Code Status: Full code Family Communication: Jethro Poling, patient's son and HCPOA [phone]) updated via phone 7/20.  Reviewed nature of patients critical illness, stable labs but not improved, concern for patient discomfort and plan of care moving forward.  Will plan to revisit in am 7/21 pending patients status with son regarding how to best care for Patricia Vang if no signs of improvement.  Disposition: ICU  Labs   CBC: Recent Labs  Lab 09/27/2019 2246 10/03/19 0218 10/04/19 0400 10/05/19 2002 10/06/19 0500  WBC 24.4* 18.5* 20.4* 17.8* 16.9*  HGB 10.6* 10.3* 11.4* 11.1* 9.9*  HCT 31.6* 31.9* 34.0* 32.3* 29.2*  MCV 89.0 89.9 87.6 84.3 85.1  PLT 79* 76* 77* 73* 66*    Basic Metabolic Panel: Recent Labs  Lab 10/13/2019 2246 10/03/19 0218 10/04/19 0400 10/05/19 2002 10/06/19 0500  NA 135 135 139 145 148*  K 4.4 4.3 4.3 3.3* 3.5  CL 107 107 109 100 100  CO2 18* 17* <7* 31 34*  GLUCOSE 82 77 140* 155* 135*  BUN 59* 61* 64* 66* 67*  CREATININE 5.47* 5.44* 6.02* 5.71* 5.47*  CALCIUM 7.5* 7.5* 8.3* 7.4* 7.4*  MG  --   --   --  2.5*  --    GFR: CrCl cannot be calculated (Unknown ideal weight.). Recent Labs  Lab 10/03/19 0218 10/04/19 0400 10/05/19 2002 10/06/19 0500  WBC 18.5* 20.4* 17.8* 16.9*    Liver Function Tests: Recent Labs  Lab 10/17/2019 2246 10/03/19 0218  AST 68* 65*  ALT 38 35  ALKPHOS 121 112  BILITOT 0.7 0.5  PROT 5.5* 5.2*  ALBUMIN 2.4* 2.2*   No results for input(s): LIPASE, AMYLASE in the last 168 hours. No results for input(s): AMMONIA in the last 168 hours.  ABG    Component Value Date/Time   PHART 7.520 (H) 10/05/2019 2120   PCO2ART 42.7 10/05/2019 2120   PO2ART 66.5 (L) 10/05/2019 2120   HCO3 34.6 (H) 10/05/2019 2120   ACIDBASEDEF 7.3 (H)  10/04/2019 0420   O2SAT 94.6 10/05/2019 2120     Coagulation Profile: Recent Labs  Lab 09/30/2019 2246  INR 1.2    Cardiac Enzymes: No results for input(s): CKTOTAL, CKMB, CKMBINDEX, TROPONINI in the last 168 hours.  HbA1C: No results found for: HGBA1C  CBG: No results for input(s): GLUCAP in the last 168 hours.   CC Time: 32 minutes   Canary Brim, MSN, NP-C [facility] 10/06/2019, 8:11 AM   Please see [email or web address] for pager details.

## 2019-10-06 NOTE — Progress Notes (Addendum)
Chaplain responding to spiritual care consult from NP re: transition to comfort care.  Son at bedside on video conference with family members. Provided support around grief - life review, identifying values, prayers with family in person and via phone.

## 2019-10-06 NOTE — Plan of Care (Signed)
Called by RN regarding change in patient status - worsening respiratory effort, tachypnea and appearance of pain.  Son at bedside.  Family discussion with Dr. Gaynell Face at bedside.  Son elects to transition to full comfort measures.  Will liberalize comfort medications and not restart levophed for hypotension.     Plan: PRN morphine & ativan ordered.  Titrate for normal respiratory effort.   PRN robinul for secretions Full comfort measures, no invasive interventions DNR / DNI  Consult to chaplain for family support    Canary Brim, MSN, NP-C [facility] 10/06/2019, 3:16 PM   Please see [email or web address] for pager details.

## 2019-10-07 ENCOUNTER — Encounter (HOSPITAL_COMMUNITY): Payer: Self-pay | Admitting: Pulmonary Disease

## 2019-10-07 ENCOUNTER — Other Ambulatory Visit: Payer: Self-pay

## 2019-10-07 MED ORDER — MORPHINE BOLUS VIA INFUSION
1.0000 mg | INTRAVENOUS | Status: DC | PRN
  Filled 2019-10-07: qty 10

## 2019-10-07 MED ORDER — SCOPOLAMINE 1 MG/3DAYS TD PT72
1.0000 | MEDICATED_PATCH | TRANSDERMAL | Status: DC
  Administered 2019-10-07: 1.5 mg via TRANSDERMAL
  Filled 2019-10-07: qty 1

## 2019-10-07 MED ORDER — MORPHINE SULFATE (PF) 2 MG/ML IV SOLN
1.0000 mg | INTRAVENOUS | Status: DC | PRN
  Filled 2019-10-07: qty 5

## 2019-10-08 LAB — CULTURE, BLOOD (ROUTINE X 2)
Culture: NO GROWTH
Culture: NO GROWTH
Special Requests: ADEQUATE
Special Requests: ADEQUATE

## 2019-10-08 LAB — AEROBIC/ANAEROBIC CULTURE W GRAM STAIN (SURGICAL/DEEP WOUND)

## 2019-10-18 NOTE — Progress Notes (Signed)
Chaplain follow up for continued support around EOL.   Present with family at time of death.  Shared prayers, grief support.

## 2019-10-18 NOTE — Progress Notes (Signed)
Patient's time of death 10:14. Surrounded by family and chaplain at time of death, verified by two RNs.

## 2019-10-18 NOTE — Death Summary Note (Signed)
DEATH SUMMARY   Patient Details  Name: Patricia Vang MRN: [id number] DOB: Aug 15, 1940  Admission/Discharge Information   Admit Date:  2019/09/21  Date of Death: Date of Death: 10/14/2019  Time of Death: Time of Death: 1014  Length of Stay: 5  Referring Physician: Everlean CherryWhyte, Thomas M, MD   Reason(s) for Hospitalization  septic shock 2/2 pyelonephritis with obstructing stone  Diagnoses  Preliminary cause of death: Septic shock (HCC) Secondary Diagnoses (including complications and co-morbidities):  Active Problems:   Alzheimer's dementia without behavioral disturbance (HCC)   Septic shock (HCC)   Acute renal failure (HCC)   Thrombocytopenia (HCC)   Pressure injury of skin   Metabolic acidosis, increased anion gap   Acute hypoxemic respiratory failure [facility](HCC)   Brief Hospital Course (including significant findings, care, treatment, and services provided and events leading to death)  Patricia Vang is a 79 y.o. year old female who has hx dementia, CHF with recent UTI treated with bactrim x2 days but [facility] 7/16 with worsening mental status, less mobile at home.  Hypotensive on arrival with SBP 90's, AKI with Scr 5.5, lactate 4.5. CT abd revealed hydronephrosis. At [facility] ED, patient was given Ceftriaxone, 1L NS bolus x 3. Central line was placed for initiation of levophed. She was tx to [facility] for ICU care and IR/urology. On arrival, she is non-verbal and on low-dose vasopressor support.   Hospital course 7/17-7/20: pt underwent percutaneous nephrostomy tube with cx positive for proteus she was started on appropriate abx. She unfortunately was unable to wean from pressors and had worsening renal function and respiratory status. Ongoing discussions with family were occurring. Pt cont to decline and appear more uncomfortable with escalating pressor requirement and increased wob. Pt was dnr and family elected to shift goals of care to comfort.   Pt  passed peacefully with family by her side. Pt expired 1014 on 10/06/2019  Pertinent Labs and Studies  Significant Diagnostic Studies DG CHEST PORT 1 VIEW  Result Date: 10/04/2019 CLINICAL DATA:  Initial evaluation for respiratory distress. EXAM: PORTABLE CHEST 1 VIEW COMPARISON:  Prior radiograph from 10/03/2019. FINDINGS: Cardiomegaly, stable. Mediastinal silhouette grossly unchanged. Right IJ approach central venous catheter remains in place with tip overlying the cavoatrial junction, stable. Extensive bilateral pulmonary opacities are seen, right worse than left, overall little interval change from previous. No visible pneumothorax. Suspected small bilateral pleural effusions. Osseous structures are unchanged. IMPRESSION: 1. No significant interval change in extensive right greater than left pulmonary opacities, concerning for multifocal pneumonia. A degree of underlying pulmonary interstitial edema is suspected. 2. Probable small bilateral pleural effusions. Electronically Signed   By: Rise MuBenjamin  McClintock M.D.   On: 10/04/2019 05:57   DG Chest Port 1 View  Result Date: 10/03/2019 CLINICAL DATA:  79 year old female with displacement of central venous catheter. EXAM: PORTABLE CHEST 1 VIEW COMPARISON:  Chest radiograph dated 10/03/2019. FINDINGS: Right IJ central venous line in similar position with tip over central SVC close to the cavoatrial junction. Interval worsening of bilateral pulmonary opacities compared to the earlier radiograph which may be partly related to shallow inspiration. No pneumothorax. Stable cardiomediastinal silhouette. No acute osseous pathology. IMPRESSION: Right IJ central venous line with tip over central SVC. No pneumothorax. Electronically Signed   By: Elgie CollardArash  Radparvar M.D.   On: 10/03/2019 21:13   DG Chest Port 1 View  Result Date: 10/03/2019 CLINICAL DATA:  Sepsis. EXAM: PORTABLE CHEST 1 VIEW COMPARISON:  October 02, 2019. FINDINGS: Stable cardiomediastinal  silhouette. No  pneumothorax is noted. New right upper and bilateral basilar opacities are noted concerning for multifocal pneumonia. Small pleural effusions may be present. Bony thorax is unremarkable. IMPRESSION: New right upper and bilateral basilar opacities are noted concerning for multifocal pneumonia. Small pleural effusions may be present. Electronically Signed   By: Lupita Raider M.D.   On: 10/03/2019 09:15   IR NEPHROSTOMY PLACEMENT LEFT  Result Date: 10/03/2019 INDICATION: 79 year old female with obstructing left-sided ureteral stone and urosepsis EXAM: IMAGE GUIDED PLACEMENT OF LEFT-SIDED PERCUTANEOUS NEPHROSTOMY COMPARISON:  None. MEDICATIONS: Inpatient antibiotics ANESTHESIA/SEDATION: Fentanyl 50 mcg IV; Versed 1.0 mg IV Moderate Sedation Time:  10 minutes The patient was continuously monitored during the procedure by the interventional radiology nurse under my direct supervision. CONTRAST:  10 cc-administered into the collecting system(s) FLUOROSCOPY TIME:  Fluoroscopy Time: 0 minutes 30 seconds COMPLICATIONS: None PROCEDURE: Informed written consent was obtained from the patient's family after a thorough discussion of the procedural risks, benefits and alternatives. All questions were addressed. Maximal Sterile Barrier Technique was utilized including caps, mask, sterile gowns, sterile gloves, sterile drape, hand hygiene and skin antiseptic. A timeout was performed prior to the initiation of the procedure. Patient positioned prone position on the fluoroscopy table. Ultrasound survey of the left flank was performed with images stored and sent to PACs. The patient was then prepped and draped in the usual sterile fashion. 1% lidocaine was used to anesthetize the skin and subcutaneous tissues for local anesthesia. A Chiba needle was then used to access a posterior inferior calyx with ultrasound guidance. With spontaneous urine returned through the needle, passage of an 018 micro wire into the collecting system was  performed under fluoroscopy. A small incision was made with an 11 blade scalpel, and the needle was removed from the wire. An Accustick system was then advanced over the wire into the collecting system under fluoroscopy. The metal stiffener and inner dilator were removed, and then a sample of fluid was aspirated through the 4 French outer sheath. Bentson wire was passed into the collecting system and the sheath removed. Ten French dilation of the soft tissues was performed. Using modified Seldinger technique, a 10 French pigtail catheter drain was placed over the Bentson wire. Wire and inner stiffener removed, and the pigtail was formed in the collecting system. Small amount of contrast confirmed position of the catheter. Patient tolerated the procedure well and remained hemodynamically stable throughout. No complications were encountered and no significant blood loss encountered IMPRESSION: Status post left-sided percutaneous nephrostomy for obstructing left ureteral stone. Signed, Yvone Neu. Reyne Dumas, RPVI [facility] Electronically Signed   By: Gilmer Mor D.O.   On: 10/03/2019 01:46    Microbiology Recent Results (from the past 240 hour(s))  MRSA PCR Screening     Status: None   Collection Time: 10/13/2019  9:54 PM   Specimen: Nasal Mucosa; Nasopharyngeal  Result Value Ref Range Status   MRSA by PCR NEGATIVE NEGATIVE Final    Comment:        The GeneXpert MRSA Assay (FDA approved for NASAL specimens only), is one component of a comprehensive MRSA colonization surveillance program. It is not intended to diagnose MRSA infection nor to guide or monitor treatment for MRSA infections. Performed at [facility], [address], [place], [place] [address]   SARS Coronavirus 2 by RT PCR (hospital order, performed in [facility] hospital lab) Nasopharyngeal Nasopharyngeal Swab     Status: None  Collection Time: 10/23/2019  10:43 PM   Specimen: Nasopharyngeal Swab  Result Value Ref Range Status   SARS Coronavirus 2 NEGATIVE NEGATIVE Final    Comment: (NOTE) SARS-CoV-2 target nucleic acids are NOT DETECTED.  The SARS-CoV-2 RNA is generally detectable in upper and lower respiratory specimens during the acute phase of infection. The lowest concentration of SARS-CoV-2 viral copies this assay can detect is 250 copies / mL. A negative result does not preclude SARS-CoV-2 infection and should not be used as the sole basis for treatment or other patient management decisions.  A negative result may occur with improper specimen collection / handling, submission of specimen other than nasopharyngeal swab, presence of viral mutation(s) within the areas targeted by this assay, and inadequate number of viral copies (<250 copies / mL). A negative result must be combined with clinical observations, patient history, and epidemiological information.  Fact Sheet for Patients:   BoilerBrush.com.cy  Fact Sheet for Healthcare Providers: https://pope.com/  This test is not yet approved or  cleared by the [place] FDA and has been authorized for detection and/or diagnosis of SARS-CoV-2 by FDA under an Emergency Use Authorization (EUA).  This EUA will remain in effect (meaning this test can be used) for the duration of the COVID-19 declaration under Section 564(b)(1) of the Act, 21 U.S.C. section 360bbb-3(b)(1), unless the authorization is terminated or revoked sooner.  Performed at [facility], [address], [place], [place] [address]   Culture, blood (routine x 2)     Status: None (Preliminary result)   Collection Time: 10-23-19 10:47 PM   Specimen: BLOOD LEFT HAND  Result Value Ref Range Status   Specimen Description   Final    BLOOD LEFT HAND Performed at [facility], [address] [place], [place] [address]    Special  Requests   Final    BOTTLES DRAWN AEROBIC ONLY Blood Culture adequate volume Performed at [facility], [address], [place], [place] [address]    Culture   Final    NO GROWTH 4 DAYS Performed at [facility], [address], [place], [place] [address]    Report Status PENDING  Incomplete  Culture, blood (routine x 2)     Status: None (Preliminary result)   Collection Time: 2019/10/23 10:52 PM   Specimen: BLOOD  Result Value Ref Range Status   Specimen Description   Final    BLOOD RIGHT ANTECUBITAL Performed at [facility], [address] [place], [place] [address]    Special Requests   Final    BOTTLES DRAWN AEROBIC ONLY Blood Culture adequate volume Performed at [facility], [address] [place], [place] [address]    Culture   Final    NO GROWTH 4 DAYS Performed at [facility], [address], [place], [place] [address]    Report Status PENDING  Incomplete  Aerobic/Anaerobic Culture (surgical/deep wound)     Status: None   Collection Time: 10/03/19  1:40 AM   Specimen: Kidney; Urine  Result Value Ref Range Status   Specimen Description KIDNEY  Final   Special Requests   Final    IMAGE GUIDED DRAIN PLACEMENT, LEFT PCN URINE FROM LEFT KIDNEY   Gram Stain   Final    ABUNDANT WBC PRESENT, PREDOMINANTLY PMN FEW GRAM NEGATIVE RODS    Culture   Final    ABUNDANT PROTEUS MIRABILIS NO ANAEROBES ISOLATED Performed at [facility],  [address], [place], [place] [address]    Report Status 10/08/2019 FINAL  Final   Organism ID, Bacteria PROTEUS MIRABILIS  Final      Susceptibility   Proteus mirabilis - MIC*    AMPICILLIN <=2 SENSITIVE Sensitive     CEFAZOLIN <=4 SENSITIVE Sensitive     CEFEPIME <=0.12 SENSITIVE Sensitive     CEFTAZIDIME <=1 SENSITIVE Sensitive     CEFTRIAXONE <=0.25 SENSITIVE Sensitive     CIPROFLOXACIN <=0.25 SENSITIVE Sensitive     GENTAMICIN <=1 SENSITIVE Sensitive      IMIPENEM 1 SENSITIVE Sensitive     TRIMETH/SULFA <=20 SENSITIVE Sensitive     AMPICILLIN/SULBACTAM <=2 SENSITIVE Sensitive     PIP/TAZO <=4 SENSITIVE Sensitive     * ABUNDANT PROTEUS MIRABILIS    Lab Basic Metabolic Panel: Recent Labs  Lab 09/25/2019 2246 10/03/19 0218 10/04/19 0400 10/05/19 2002 10/06/19 0500  NA 135 135 139 145 148*  K 4.4 4.3 4.3 3.3* 3.5  CL 107 107 109 100 100  CO2 18* 17* <7* 31 34*  GLUCOSE 82 77 140* 155* 135*  BUN 59* 61* 64* 66* 67*  CREATININE 5.47* 5.44* 6.02* 5.71* 5.47*  CALCIUM 7.5* 7.5* 8.3* 7.4* 7.4*  MG  --   --   --  2.5*  --    Liver Function Tests: Recent Labs  Lab 09/21/2019 2246 10/03/19 0218  AST 68* 65*  ALT 38 35  ALKPHOS 121 112  BILITOT 0.7 0.5  PROT 5.5* 5.2*  ALBUMIN 2.4* 2.2*   No results for input(s): LIPASE, AMYLASE in the last 168 hours. No results for input(s): AMMONIA in the last 168 hours. CBC: Recent Labs  Lab 09/27/2019 2246 10/03/19 0218 10/04/19 0400 10/05/19 2002 10/06/19 0500  WBC 24.4* 18.5* 20.4* 17.8* 16.9*  HGB 10.6* 10.3* 11.4* 11.1* 9.9*  HCT 31.6* 31.9* 34.0* 32.3* 29.2*  MCV 89.0 89.9 87.6 84.3 85.1  PLT 79* 76* 77* 73* 66*   Cardiac Enzymes: No results for input(s): CKTOTAL, CKMB, CKMBINDEX, TROPONINI in the last 168 hours. Sepsis Labs: Recent Labs  Lab 10/03/19 0218 10/04/19 0400 10/05/19 2002 10/06/19 0500  WBC 18.5* 20.4* 17.8* 16.9*    Procedures/Operations  See epic for complete details R IJ CVL 7/16 Percutaneous nephrostomy 7/17  Briant Sites 10/08/2019, 5:23 PM

## 2019-10-18 NOTE — Progress Notes (Signed)
NAME:  Patricia Vang, MRN:  [id number], DOB:  05-Mar-1941, LOS: 5 ADMISSION DATE:  09/22/2019, CONSULTATION DATE:  7/16 REFERRING MD:  Duke Salvia, CHIEF COMPLAINT:  Sepsis, hydronephrosis   Brief History   79 y/o F, non-verbal with hx dementia, CHF with recent UTI treated with bactrim x 2 days PTA who presented to [facility] 7/16 with worsening mental status, less mobile at home.  Hypotensive on arrival with SBP 90's, AKI with Scr 5.5, lactate 4.5.  CT abd revealed moderate L hydronephrosis extending down to a 1.0 cm proximal ureteral calculus and just proximal to this is a 0.6 cm proximal uteral calculus. UA opaque and brown with 1+ protein, 3+ blood, 3+ leuks. She was tx to [facility] for ICU care and IR/urology.    Family made NCB  Pm 7/17 as per  her previously documented wishes.  Past Medical History   has a past medical history of Alzheimer's dementia (HCC), Cardiomyopathy (HCC), ESBL (extended spectrum beta-lactamase) producing bacteria infection, Failure to thrive in adult, Hypercalcemia, Hyperparathyroidism (HCC), Hypertensive CHF (HCC), Mild cognitive impairment, Nephrolithiasis, and Vitamin D deficiency.   Significant Hospital Events   7/16 Transferred from [facility] 7/17  Left PCN by IR 7/21 Transitioned to Comfort Measures   Consults:  IR Urology  Procedures:  R IJ CVL Duke Salvia) 7/16 >>  Significant Diagnostic Tests:  CT abd/pelvis Duke Salvia) >> CT abd revealed moderate L hydronephrosis extending down to a 1.0 cm proximal ureteral calculus and just proximal to this is a 0.6 cm proximal uteral calculus.   Micro Data:  Covid-19 PCR  7/16 >> neg  MRSA  PCR 7/16 >> neg  Urine 7/16 >> BC x 2 7/16 >> PCN Drain 7/17 >> proteus mirabilis >>  Antimicrobials:  Ceftriaxone 7/16 >> 7/18 Meropenem  7/18 >>  Interim history/subjective:  Tmax 99.7 / WBC 16.9 RN reports pt appears uncomfortable, low dose morphine drops BP On 7 mcg's levophed  I/O 495 ml  UOP, +3L in last 24 hours  Objective   Blood pressure (!) 155/72, pulse 80, temperature 99.8 F (37.7 C), temperature source Axillary, resp. rate 15, height 5\' 2"  (1.575 m), weight 67.6 kg, SpO2 (!) 88 %. CVP:  [7 mmHg] 7 mmHg  FiO2 (%):  [100 %] 100 %   Intake/Output Summary (Last 24 hours) at 16-Oct-2019 10/09/2019 Last data filed at 10/16/19 0700 Gross per 24 hour  Intake 788.67 ml  Output 775 ml  Net 13.67 ml   Filed Weights   10/05/19 0500 10/06/19 0500 10-16-19 0500  Weight: 67.6 kg 67.6 kg 67.6 kg     Physical Exam: General: elderly female lying in bed, no distress noted  HEENT: Dry MM  Neuro: Does not follow commands, obtunded CV: RRR, no MRG PULM: Rhonchi noted, no increased work of breathing  GI: soft, bsx4 active  Extremities: warm/dry, generalized trace to 1+ edema  Skin: no rashes or lesions  Resolved Hospital Problem list     Assessment & Plan:   Septic Shock secondary to Obstructive Uropathy, UTI / Pyelonephritis with Hydronephrosis  Acute Hypoxemic Respiratory Failure with Diffuse Infiltrates  AKI  AG Metabolic Acidosis  Thrombocytpenia Leukocytosis  Hx Dementia  Nonverbal at baseline/ at risk TME  Pain -Continue Comfort Measures -Patient remains on 10L HFNC >> Have discussed this with nurse and family, plans to remove over next hour -Continue Morphine gtt >> Titrate for Comfort/Respiratory Distress -PRN Robinul ordered >> Will add scopolamine patch as well   Best practice:  Family Communication: Son and Daughter in Social worker at bedside. Plans to transfer to medical bed if patient remains stable by this afternoon.   Labs   CBC: Recent Labs  Lab 10/08/2019 2246 10/03/19 0218 10/04/19 0400 10/05/19 2002 10/06/19 0500  WBC 24.4* 18.5* 20.4* 17.8* 16.9*  HGB 10.6* 10.3* 11.4* 11.1* 9.9*  HCT 31.6* 31.9* 34.0* 32.3* 29.2*  MCV 89.0 89.9 87.6 84.3 85.1  PLT 79* 76* 77* 73* 66*    Basic Metabolic Panel: Recent Labs  Lab 10/01/2019 2246 10/03/19 0218  10/04/19 0400 10/05/19 2002 10/06/19 0500  NA 135 135 139 145 148*  K 4.4 4.3 4.3 3.3* 3.5  CL 107 107 109 100 100  CO2 18* 17* <7* 31 34*  GLUCOSE 82 77 140* 155* 135*  BUN 59* 61* 64* 66* 67*  CREATININE 5.47* 5.44* 6.02* 5.71* 5.47*  CALCIUM 7.5* 7.5* 8.3* 7.4* 7.4*  MG  --   --   --  2.5*  --    GFR: Estimated Creatinine Clearance: 7.5 mL/min (A) (by C-G formula based on SCr of 5.47 mg/dL (H)). Recent Labs  Lab 10/03/19 0218 10/04/19 0400 10/05/19 2002 10/06/19 0500  WBC 18.5* 20.4* 17.8* 16.9*    Liver Function Tests: Recent Labs  Lab 09/24/2019 2246 10/03/19 0218  AST 68* 65*  ALT 38 35  ALKPHOS 121 112  BILITOT 0.7 0.5  PROT 5.5* 5.2*  ALBUMIN 2.4* 2.2*   No results for input(s): LIPASE, AMYLASE in the last 168 hours. No results for input(s): AMMONIA in the last 168 hours.  ABG    Component Value Date/Time   PHART 7.520 (H) 10/05/2019 2120   PCO2ART 42.7 10/05/2019 2120   PO2ART 66.5 (L) 10/05/2019 2120   HCO3 34.6 (H) 10/05/2019 2120   ACIDBASEDEF 7.3 (H) 10/04/2019 0420   O2SAT 94.6 10/05/2019 2120     Coagulation Profile: Recent Labs  Lab 10/01/2019 2246  INR 1.2    Cardiac Enzymes: No results for input(s): CKTOTAL, CKMB, CKMBINDEX, TROPONINI in the last 168 hours.  HbA1C: No results found for: HGBA1C  CBG: No results for input(s): GLUCAP in the last 168 hours.   Jovita Kussmaul, AGACNP-BC [facility]  Pgr: [phone]  PCCM Pgr: [phone]

## 2019-10-18 NOTE — TOC Transition Note (Signed)
Transition of Care [facility]) - CM/SW Discharge Note   Patient Details  Name: Patricia Vang MRN: [id number] Date of Birth: 04/20/1940  Transition of Care [facility]) CM/SW Contact:  Golda Acre, RN Phone Number: 10/14/2019, 2:36 PM   Clinical Narrative:    Patient expired   Final next level of care: Expired Barriers to Discharge: Barriers Resolved   Patient Goals and CMS Choice Patient states their goals for this hospitalization and ongoing recovery are:: to go home CMS Medicare.gov Compare Post Acute Care list provided to:: Patient    Discharge Placement                       Discharge Plan and Services   Discharge Planning Services: CM Consult                                 Social Determinants of Health (SDOH) Interventions     Readmission Risk Interventions No flowsheet data found.

## 2019-10-18 DEATH — deceased

## 2021-01-25 IMAGING — US IR NEPHROSTOMY PLACEMENT LEFT
1 series · 1 of 1 positions shown · non-contrast
Comparison: None.

INDICATION: 79-year-old female with obstructing left-sided ureteral stone and
urosepsis

EXAM:
IMAGE GUIDED PLACEMENT OF LEFT-SIDED PERCUTANEOUS NEPHROSTOMY

[Series 1: (id) · 1 of 1 slices shown]
[im 1/1]
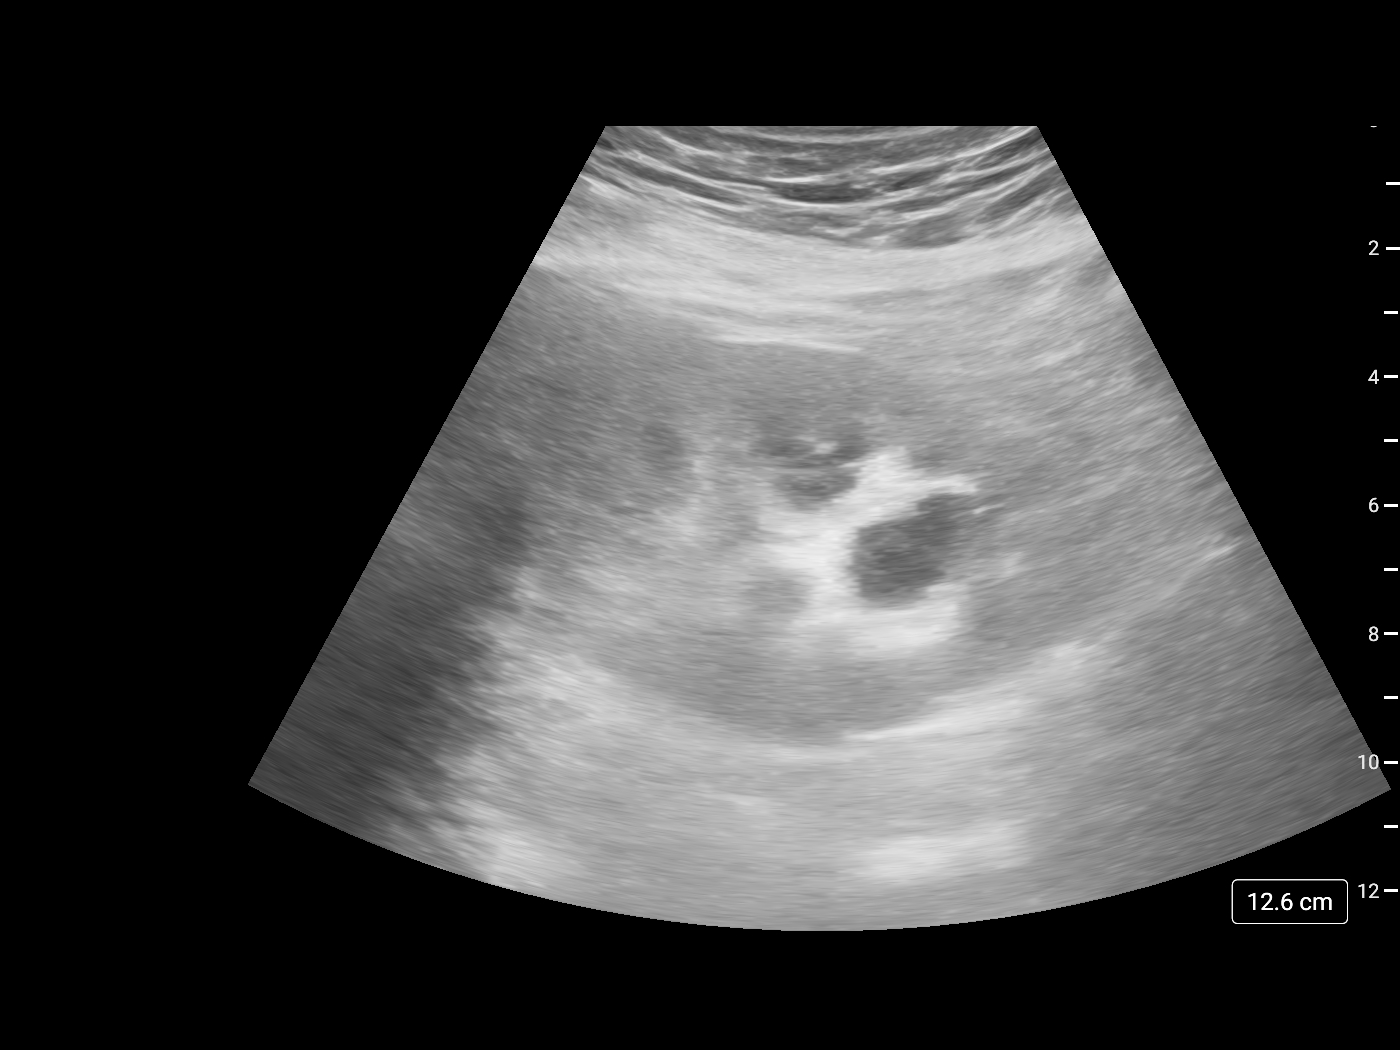

[1 of 1 positions shown; findings below may reference images not displayed]

MEDICATIONS:
Inpatient antibiotics

ANESTHESIA/SEDATION:
Fentanyl 50 mcg IV; Versed 1.0 mg IV

Moderate Sedation Time:  10 minutes

The patient was continuously monitored during the procedure by the
interventional radiology nurse under my direct supervision.

CONTRAST:  10 cc-administered into the collecting system(s)

FLUOROSCOPY TIME:  Fluoroscopy Time: 0 minutes 30 seconds

COMPLICATIONS:
None

PROCEDURE:
Informed written consent was obtained from the patient's family
after a thorough discussion of the procedural risks, benefits and
alternatives. All questions were addressed. Maximal Sterile Barrier
Technique was utilized including caps, mask, sterile gowns, sterile
gloves, sterile drape, hand hygiene and skin antiseptic. A timeout
was performed prior to the initiation of the procedure.

Patient positioned prone position on the fluoroscopy table.
Ultrasound survey of the left flank was performed with images stored
and sent to PACs.

The patient was then prepped and draped in the usual sterile
fashion. 1% lidocaine was used to anesthetize the skin and
subcutaneous tissues for local anesthesia.

A Chiba needle was then used to access a posterior inferior calyx
with ultrasound guidance. With spontaneous urine returned through
the needle, passage of an 018 micro wire into the collecting system
was performed under fluoroscopy.

A small incision was made with an 11 blade scalpel, and the needle
was removed from the wire.

An Accustick system was then advanced over the wire into the
collecting system under fluoroscopy. The metal stiffener and inner
dilator were removed, and then a sample of fluid was aspirated
through the 4 French outer sheath.

Bentson wire was passed into the collecting system and the sheath
removed. Ten French dilation of the soft tissues was performed.

Using modified Seldinger technique, a 10 French pigtail catheter
drain was placed over the Bentson wire.

Wire and inner stiffener removed, and the pigtail was formed in the
collecting system.

Small amount of contrast confirmed position of the catheter.

Patient tolerated the procedure well and remained hemodynamically
stable throughout.

No complications were encountered and no significant blood loss
encountered
IMPRESSION: Status post left-sided percutaneous nephrostomy for obstructing left
ureteral stone.

## 2021-01-27 IMAGING — DX DG CHEST 1V PORT
1 series · 1 of 1 positions shown · non-contrast
Comparison: Prior radiograph from 10/03/2019.

CLINICAL DATA: Initial evaluation for respiratory distress.

EXAM:
PORTABLE CHEST 1 VIEW

[chest ap]
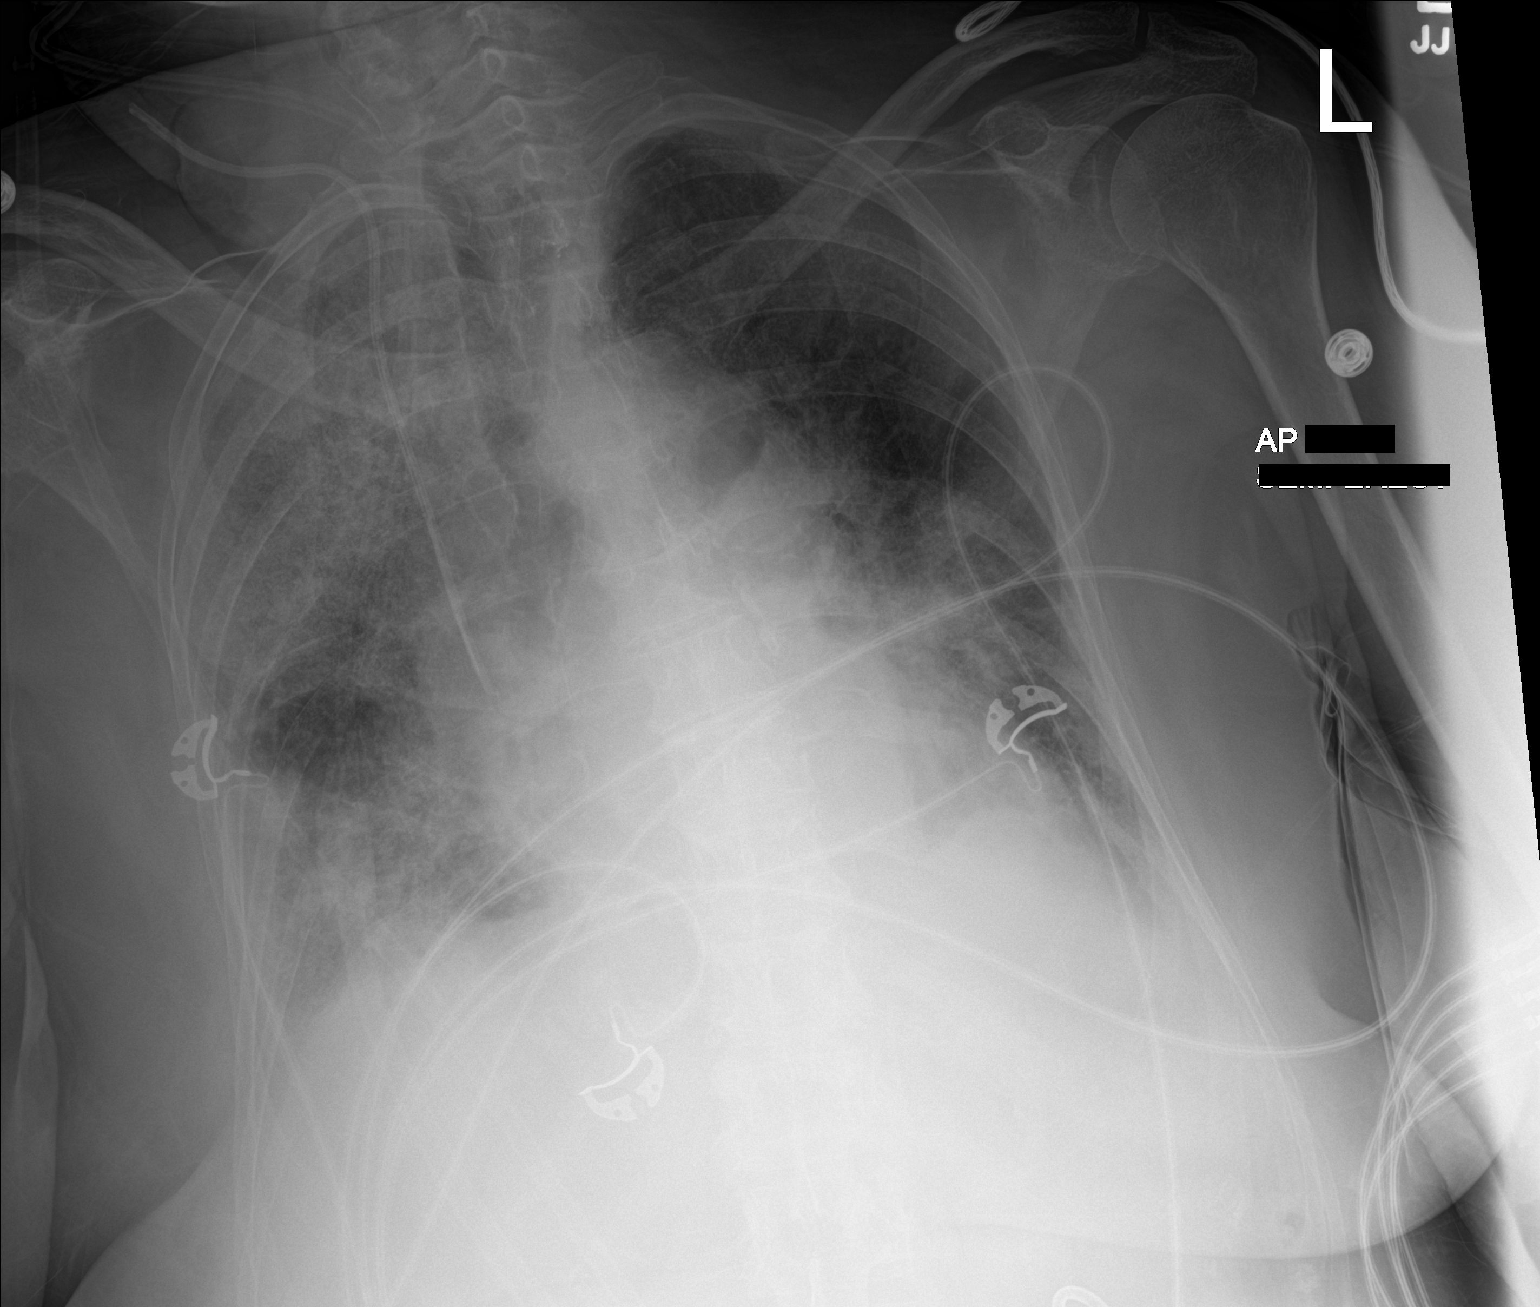

[1 of 1 positions shown; findings below may reference images not displayed]

FINDINGS: Cardiomegaly, stable. Mediastinal silhouette grossly unchanged.
Right IJ approach central venous catheter remains in place with tip
overlying the cavoatrial junction, stable.

Extensive bilateral pulmonary opacities are seen, right worse than
left, overall little interval change from previous. No visible
pneumothorax. Suspected small bilateral pleural effusions.

Osseous structures are unchanged.
IMPRESSION: 1. No significant interval change in extensive right greater than
left pulmonary opacities, concerning for multifocal pneumonia. A
degree of underlying pulmonary interstitial edema is suspected.
2. Probable small bilateral pleural effusions.
